# Patient Record
Sex: Male | Born: 2000 | Race: Black or African American | Hispanic: No | Marital: Single | Smoking: Never smoker
Health system: Southern US, Community
[De-identification: ages and names within clinical notes are randomized; demographics above are authoritative.]

## PROBLEM LIST (undated history)

## (undated) DIAGNOSIS — S43006A Unspecified dislocation of unspecified shoulder joint, initial encounter: Secondary | ICD-10-CM

## (undated) HISTORY — PX: WISDOM TOOTH EXTRACTION: SHX21

---
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
## 2019-01-03 ENCOUNTER — Encounter (HOSPITAL_COMMUNITY): Payer: Self-pay

## 2019-01-03 ENCOUNTER — Ambulatory Visit (HOSPITAL_COMMUNITY)
Admission: EM | Admit: 2019-01-03 | Discharge: 2019-01-03 | Disposition: A | Discharge status: Home / Self Care | Payer: Managed Care, Other (non HMO) | Attending: Internal Medicine | Admitting: Internal Medicine

## 2019-01-03 ENCOUNTER — Ambulatory Visit (INDEPENDENT_AMBULATORY_CARE_PROVIDER_SITE_OTHER): Payer: Managed Care, Other (non HMO)

## 2019-01-03 DIAGNOSIS — M25552 Pain in left hip: Secondary | ICD-10-CM

## 2019-01-03 DIAGNOSIS — M7632 Iliotibial band syndrome, left leg: Secondary | ICD-10-CM | POA: Diagnosis not present

## 2019-01-03 DIAGNOSIS — M461 Sacroiliitis, not elsewhere classified: Secondary | ICD-10-CM | POA: Diagnosis not present

## 2019-01-03 MED ORDER — TRAMADOL HCL 50 MG PO TABS: 50.0000 mg | ORAL_TABLET | Freq: Four times a day (QID) | ORAL | 0 refills | Status: DC | PRN

## 2019-01-03 MED ORDER — CYCLOBENZAPRINE HCL 10 MG PO TABS: ORAL_TABLET | ORAL | 0 refills | Status: DC

## 2019-01-03 NOTE — ED Provider Notes (Signed)
[facility]    CSN: [id number] Arrival date & time: 01/03/19  1853      History   Chief Complaint Chief Complaint  Patient presents with  . Hip Injury  . Back Pain  . Leg Pain    HPI Austin Perkins is a 18 y.o. male. who presents with back pain whe n he was lifting a 127 lb box helping himself by pushing with his foot on the bottom of the box to get it to his shoulders and felt a pop and pain on his L lower back and L leg to his toes. He pushed himself to continue working limping around. He did not report it to his employer since there was was a replacement for his usual supervisor. When he went home he was in severe pain and collapsed on the floor and his brother and mother helped him get up off the floor. H applied icyphot and took one rx  Ibuprofen ( unknown dose) which did not help, so around 2 am took a Tylenol pm and was able to fall asleep. Woke up around 11 am this am with a lot of pain. He applied icy- hot again and laid around all day. He finally decided to come since he could not hardly get in the shower. Has weakness on his L leg and feels when he tries to walk his leg gives out. Pain is described as aching, which is constant. Pain is provoked with walking, baring wt on this L leg. Pain is alleviated with no movement to 6//10, but when tries to move pain is 8/10. Denies loss of bladder or bowels. Denies pelvic area numbness.  Denies prior injury or back or his L leg before.    Past Medical History:  Diagnosis Date  . Shoulder dislocation     Patient Active Problem List   Diagnosis Date Noted  . Acromioclavicular sprain, right, initial encounter 12/16/2015    Past Surgical History:  Procedure Laterality Date  . WISDOM TOOTH EXTRACTION         Home Medications    Prior to Admission medications   Medication Sig Start Date End Date Taking? Authorizing Provider  diclofenac (VOLTAREN) 75 MG EC tablet Take 1 tablet (75 mg total) by mouth 2 (two) times  daily. Patient not taking: Reported on 09/03/2018 04/27/18   Vanessa Kick, MD  fluticasone [facility]) 50 MCG/ACT nasal spray Place 2 sprays into both nostrils daily. 12/26/18   Couture, Cortni S, PA-C  naproxen (NAPROSYN) 375 MG tablet Take 1 tablet (375 mg total) by mouth 2 (two) times daily. 09/03/18   Margarita Mail, PA-C    Family History Family History  Problem Relation Age of Onset  . Healthy Mother     Social History Social History   Tobacco Use  . Smoking status: Never Smoker  . Smokeless tobacco: Never Used  Substance Use Topics  . Alcohol use: No  . Drug use: No     Allergies   Amoxicillin   Review of Systems Review of Systems  Constitutional: Negative for chills and fever.  Gastrointestinal: Negative for abdominal pain.       Denies bowel incontinence  Genitourinary: Negative for difficulty urinating and flank pain.  Musculoskeletal: Positive for arthralgias, back pain, gait problem and myalgias.  Skin: Negative for rash and wound.  Neurological: Positive for weakness. Negative for numbness.     Physical Exam Triage Vital Signs ED Triage Vitals  Enc Vitals Group     BP 01/03/19  1930 138/70     Pulse Rate 01/03/19 1930 (!) 58     Resp 01/03/19 1930 16     Temp 01/03/19 1930 99.2 F (37.3 C)     Temp Source 01/03/19 1930 Oral     SpO2 01/03/19 1930 97 %     Weight --      Height --      Head Circumference --      Peak Flow --      Pain Score 01/03/19 1928 8     Pain Loc --      Pain Edu? --      Excl. in [place]? --    No data found.  Updated Vital Signs BP 138/70 (BP Location: Left Arm)   Pulse (!) 58   Temp 99.2 F (37.3 C) (Oral)   Resp 16   SpO2 97%   Visual Acuity Right Eye Distance:   Left Eye Distance:   Bilateral Distance:    Right Eye Near:   Left Eye Near:    Bilateral Near:     Physical Exam Vitals signs and nursing note reviewed.  Constitutional:      General: He is not in acute distress.    Appearance: Normal appearance.  He is not toxic-appearing.  HENT:     Head: Normocephalic.     Right Ear: External ear normal.     Left Ear: External ear normal.     Nose: Nose normal.  Eyes:     General: No scleral icterus.    Extraocular Movements: Extraocular movements intact.     Conjunctiva/sclera: Conjunctivae normal.  Neck:     Musculoskeletal: Neck supple.  Pulmonary:     Effort: Pulmonary effort is normal.  Musculoskeletal:        General: Tenderness present. No swelling or deformity.     Right lower leg: No edema.     Left lower leg: No edema.     Comments: BACK- no scoliosis noted, has local tenderness on L SI region to piriformis region. Has Neg SLR. ROM is decreased due to pain.   PELVIS/HIP- leg length is equal, has local tenderness on the L proximal IT band region with hip rotation, palpation, and straightening his L leg up.   Skin:    General: Skin is warm and dry.  Neurological:     Mental Status: He is alert and oriented to person, place, and time.     Sensory: No sensory deficit.     Motor: Weakness present.     Gait: Gait abnormal.     Deep Tendon Reflexes: Reflexes normal.     Comments: L leg strength +3/5, R leg strength +5/5 Has a limping gait.   Psychiatric:        Mood and Affect: Mood normal.        Behavior: Behavior normal.        Thought Content: Thought content normal.        Judgment: Judgment normal.    UC Treatments / Results  Labs (all labs ordered are listed, but only abnormal results are displayed) Labs Reviewed - No data to display  EKG   Radiology No results found.  Procedures Procedures (including critical care time)  Medications Ordered in UC Medications - No data to display  Initial Impression / Assessment and Plan / UC Course  I have reviewed the triage vital signs and the nursing notes. Pertinent  imaging results that were available during my care of the patient were reviewed  by me and considered in my medical decision making (see chart for  details). I placed him on Tramadol and Flexeril. Advised to ice areas of pain for 20 min 2-4 times a day.  Needs to report his job injury to his supervisor and FU with occ med at [facility] or their preferred location in 2 days.   Final Clinical Impressions(s) / UC Diagnoses   Final diagnoses:  None   Discharge Instructions   None    ED Prescriptions    None     PDMP not reviewed this encounter.   Shelby Mattocks, Hershal Coria 01/03/19 2221

## 2019-01-03 NOTE — Discharge Instructions (Addendum)
Follow up with Occupational Health in 2 days. Use ice on areas of pain 20 minutes at a time 2-4 times a day.

## 2019-01-03 NOTE — ED Triage Notes (Signed)
Pt present with hip pain, lower back pain and left leg pain x 1 day. Pt states he was lifting a heavy object last night at work, and he felt his left leg popped. Pt denies any numbness or tingling sensation in his left leg.

## 2019-02-12 ENCOUNTER — Emergency Department (HOSPITAL_COMMUNITY): Payer: Commercial Indemnity

## 2019-02-12 ENCOUNTER — Other Ambulatory Visit: Payer: Self-pay

## 2019-02-12 ENCOUNTER — Emergency Department (HOSPITAL_COMMUNITY)
Admission: EM | Admit: 2019-02-12 | Discharge: 2019-02-12 | Disposition: A | Discharge status: Home / Self Care | Payer: Commercial Indemnity | Attending: Emergency Medicine | Admitting: Emergency Medicine

## 2019-02-12 ENCOUNTER — Encounter (HOSPITAL_COMMUNITY): Payer: Self-pay | Admitting: Emergency Medicine

## 2019-02-12 DIAGNOSIS — U071 COVID-19: Secondary | ICD-10-CM | POA: Diagnosis not present

## 2019-02-12 DIAGNOSIS — R05 Cough: Secondary | ICD-10-CM | POA: Diagnosis not present

## 2019-02-12 DIAGNOSIS — R0789 Other chest pain: Secondary | ICD-10-CM | POA: Diagnosis not present

## 2019-02-12 DIAGNOSIS — R0602 Shortness of breath: Secondary | ICD-10-CM | POA: Diagnosis present

## 2019-02-12 DIAGNOSIS — J069 Acute upper respiratory infection, unspecified: Secondary | ICD-10-CM

## 2019-02-12 MED ORDER — GUAIFENESIN ER 1200 MG PO TB12: 1.0000 | ORAL_TABLET | Freq: Two times a day (BID) | ORAL | 0 refills | Status: DC

## 2019-02-12 MED ORDER — PROMETHAZINE-DM 6.25-15 MG/5ML PO SYRP: 5.0000 mL | ORAL_SOLUTION | Freq: Four times a day (QID) | ORAL | 0 refills | Status: DC | PRN

## 2019-02-12 NOTE — ED Triage Notes (Signed)
C/o non-productive cough, sob, and pain across chest only when coughing since yesterday.  Reports his mom his COVID +.

## 2019-02-12 NOTE — ED Notes (Signed)
Pt discharge instructions and prescriptions reviewed with the patient. The patient verbalized understanding of both. Pt discharged. 

## 2019-02-12 NOTE — Discharge Instructions (Signed)
You will need to quarantine until your results have returned.  Return here for any worsening in your condition.  Increase your fluid intake and rest as much as possible.

## 2019-02-13 LAB — NOVEL CORONAVIRUS, NAA (HOSP ORDER, SEND-OUT TO REF LAB; TAT 18-24 HRS): SARS-CoV-2, NAA: DETECTED — AB

## 2019-02-14 ENCOUNTER — Telehealth (HOSPITAL_COMMUNITY): Payer: Self-pay

## 2019-02-14 NOTE — ED Provider Notes (Signed)
[place] EMERGENCY DEPARTMENT Provider Note   CSN: [id number] Arrival date & time: 02/12/19  1001     History Chief Complaint  Patient presents with  . ? COVID  . Cough  . Shortness of Breath    Austin Perkins is a 18 y.o. male.  HPI Patient presents to the emergency department with productive cough with pain across his chest when coughing along with shortness of breath is been ongoing for 2 days.  The patient states his mom is Covid positive.  Patient states is not take any medications prior to arrival for symptoms.  Patient denies any other symptoms at this time.  The patient denies headache,blurred vision, neck pain, fever, weakness, numbness, dizziness, anorexia, edema, abdominal pain, nausea, vomiting, diarrhea, rash, back pain, dysuria, hematemesis, bloody stool, near syncope, or syncope.    Past Medical History:  Diagnosis Date  . Shoulder dislocation     Patient Active Problem List   Diagnosis Date Noted  . Acromioclavicular sprain, right, initial encounter 12/16/2015    Past Surgical History:  Procedure Laterality Date  . WISDOM TOOTH EXTRACTION         Family History  Problem Relation Age of Onset  . Healthy Mother     Social History   Tobacco Use  . Smoking status: Never Smoker  . Smokeless tobacco: Never Used  Substance Use Topics  . Alcohol use: No  . Drug use: No    Home Medications Prior to Admission medications   Medication Sig Start Date End Date Taking? Authorizing Provider  cyclobenzaprine (FLEXERIL) 10 MG tablet 1/2 to 1 tid prn back pain and muscle tightness 01/03/19   Rodriguez-Southworth, Sunday Spillers, PA-C  fluticasone (FLONASE) 50 MCG/ACT nasal spray Place 2 sprays into both nostrils daily. 12/26/18   Couture, Cortni S, PA-C  Guaifenesin 1200 MG TB12 Take 1 tablet (1,200 mg total) by mouth 2 (two) times daily. 02/12/19   Karaline Buresh, Harrell Gave, PA-C  naproxen (NAPROSYN) 375 MG tablet Take 1 tablet (375 mg total) by  mouth 2 (two) times daily. 09/03/18   Harris, Vernie Shanks, PA-C  promethazine-dextromethorphan (PROMETHAZINE-DM) 6.25-15 MG/5ML syrup Take 5 mLs by mouth 4 (four) times daily as needed for cough. 02/12/19   Baylyn Sickles, Harrell Gave, PA-C  traMADol (ULTRAM) 50 MG tablet Take 1 tablet (50 mg total) by mouth every 6 (six) hours as needed for moderate pain. 01/03/19   Rodriguez-Southworth, Sunday Spillers, PA-C    Allergies    Amoxicillin  Review of Systems   Review of Systems All other systems negative except as documented in the HPI. All pertinent positives and negatives as reviewed in the HPI. Physical Exam Updated Vital Signs BP (!) 152/91 (BP Location: Right Arm)   Pulse 77   Temp 98.6 F (37 C) (Oral)   Resp 16   SpO2 98%   Physical Exam Vitals and nursing note reviewed.  Constitutional:      General: He is not in acute distress.    Appearance: He is well-developed.  HENT:     Head: Normocephalic and atraumatic.  Eyes:     Pupils: Pupils are equal, round, and reactive to light.  Cardiovascular:     Rate and Rhythm: Normal rate and regular rhythm.     Heart sounds: Normal heart sounds. No murmur. No friction rub. No gallop.   Pulmonary:     Effort: Pulmonary effort is normal. No respiratory distress.     Breath sounds: Normal breath sounds. No wheezing.  Abdominal:     General:  Bowel sounds are normal. There is no distension.     Palpations: Abdomen is soft.     Tenderness: There is no abdominal tenderness.  Musculoskeletal:     Cervical back: Normal range of motion and neck supple.  Skin:    General: Skin is warm and dry.     Capillary Refill: Capillary refill takes less than 2 seconds.     Findings: No erythema or rash.  Neurological:     Mental Status: He is alert and oriented to person, place, and time.     Motor: No abnormal muscle tone.     Coordination: Coordination normal.  Psychiatric:        Behavior: Behavior normal.     ED Results / Procedures / Treatments    Labs (all labs ordered are listed, but only abnormal results are displayed) Labs Reviewed  NOVEL CORONAVIRUS, NAA (HOSP ORDER, SEND-OUT TO REF LAB; TAT 18-24 HRS) - Abnormal; Notable for the following components:      Result Value   SARS-CoV-2, NAA DETECTED (*)    All other components within normal limits    EKG None  Radiology No results found.  Procedures Procedures (including critical care time)  Medications Ordered in ED Medications - No data to display  ED Course  I have reviewed the triage vital signs and the nursing notes.  Pertinent labs & imaging results that were available during my care of the patient were reviewed by me and considered in my medical decision making (see chart for details).    MDM Rules/Calculators/A&P                      [place] no signs of acute distress at this time.  Advised patient that he will need to return here for any worsening in his condition.  Told him to quarantine until his results are back and the patient is advised to increase fluid intake and rest as much as possible. Final Clinical Impression(s) / ED Diagnoses Final diagnoses:  Upper respiratory tract infection, unspecified type    Rx / DC Orders ED Discharge Orders         Ordered    promethazine-dextromethorphan (PROMETHAZINE-DM) 6.25-15 MG/5ML syrup  4 times daily PRN     02/12/19 1333    Guaifenesin 1200 MG TB12  2 times daily     02/12/19 [address], PA-C 02/14/19 1609    Quintella Reichert, MD 02/15/19 [phone]

## 2019-06-02 ENCOUNTER — Encounter (HOSPITAL_COMMUNITY): Payer: Self-pay

## 2019-06-02 ENCOUNTER — Ambulatory Visit (HOSPITAL_COMMUNITY)
Admission: EM | Admit: 2019-06-02 | Discharge: 2019-06-02 | Disposition: A | Discharge status: Home / Self Care | Payer: Managed Care, Other (non HMO) | Attending: Family Medicine | Admitting: Family Medicine

## 2019-06-02 ENCOUNTER — Other Ambulatory Visit: Payer: Self-pay

## 2019-06-02 DIAGNOSIS — J020 Streptococcal pharyngitis: Secondary | ICD-10-CM | POA: Insufficient documentation

## 2019-06-02 DIAGNOSIS — Z20822 Contact with and (suspected) exposure to covid-19: Secondary | ICD-10-CM | POA: Insufficient documentation

## 2019-06-02 LAB — POCT RAPID STREP A: Streptococcus, Group A Screen (Direct): POSITIVE — AB

## 2019-06-02 MED ORDER — AZITHROMYCIN 250 MG PO TABS: ORAL_TABLET | ORAL | 0 refills | Status: DC

## 2019-06-02 MED ORDER — LIDOCAINE VISCOUS HCL 2 % MT SOLN: 15.0000 mL | OROMUCOSAL | 0 refills | Status: DC | PRN

## 2019-06-02 NOTE — ED Triage Notes (Signed)
Pt c/o sore throat for two days, also reports runny nose and HA.  Denies cough, SOB, abdom pain, n/v/d, fever, chills.  Was COVID positive 02/12/19 Tonsils enlarged, erythemic, with white exudate.

## 2019-06-04 LAB — SARS CORONAVIRUS 2 (TAT 6-24 HRS): SARS Coronavirus 2: NEGATIVE

## 2019-06-04 NOTE — ED Provider Notes (Addendum)
[place]    CSN: [id number] Arrival date & time: 06/02/19  1818      History   Chief Complaint Chief Complaint  Patient presents with  . Sore Throat    HPI Austin Perkins is a 19 y.o. male.   HPI  Patient presents for evaluation of sore throat x 2 days. Associated symptoms include nasal drainage, headache, and severe pain with swallowing. S/P COVID-11 February 2019. Denies fever, nausea, vomiting, abdominal pain, cough, or shortness of breath. Denies knowledge of recent sick contacts or exposure to anyone recently infected with COVID-19. Past Medical History:  Diagnosis Date  . Shoulder dislocation     Patient Active Problem List   Diagnosis Date Noted  . Acromioclavicular sprain, right, initial encounter 12/16/2015    Past Surgical History:  Procedure Laterality Date  . WISDOM TOOTH EXTRACTION         Home Medications    Prior to Admission medications   Medication Sig Start Date End Date Taking? Authorizing Provider  azithromycin (ZITHROMAX) 250 MG tablet Take 2 tabs PO x 1 dose, then 1 tab PO QD x 4 days 06/02/19   Scot Jun, FNP  cyclobenzaprine (FLEXERIL) 10 MG tablet 1/2 to 1 tid prn back pain and muscle tightness 01/03/19   Rodriguez-Southworth, Sunday Spillers, PA-C  fluticasone (FLONASE) 50 MCG/ACT nasal spray Place 2 sprays into both nostrils daily. 12/26/18   Couture, Cortni S, PA-C  Guaifenesin 1200 MG TB12 Take 1 tablet (1,200 mg total) by mouth 2 (two) times daily. 02/12/19   Lawyer, Harrell Gave, PA-C  lidocaine (XYLOCAINE) 2 % solution Use as directed 15 mLs in the mouth or throat every 3 (three) hours as needed for mouth pain. 06/02/19   Scot Jun, FNP  naproxen (NAPROSYN) 375 MG tablet Take 1 tablet (375 mg total) by mouth 2 (two) times daily. 09/03/18   Albert Hersch, Vernie Shanks, PA-C  promethazine-dextromethorphan (PROMETHAZINE-DM) 6.25-15 MG/5ML syrup Take 5 mLs by mouth 4 (four) times daily as needed for cough. 02/12/19   Lawyer,  Harrell Gave, PA-C  traMADol (ULTRAM) 50 MG tablet Take 1 tablet (50 mg total) by mouth every 6 (six) hours as needed for moderate pain. 01/03/19   Rodriguez-Southworth, Sunday Spillers, PA-C    Family History Family History  Problem Relation Age of Onset  . Healthy Mother     Social History Social History   Tobacco Use  . Smoking status: Never Smoker  . Smokeless tobacco: Never Used  Substance Use Topics  . Alcohol use: No  . Drug use: No     Allergies   Amoxicillin   Review of Systems Review of Systems Pertinent negatives listed in HPI  Physical Exam Triage Vital Signs ED Triage Vitals  Enc Vitals Group     BP 06/02/19 1958 (!) 153/87     Pulse Rate 06/02/19 1958 86     Resp 06/02/19 1958 18     Temp 06/02/19 1958 99.1 F (37.3 C)     Temp Source 06/02/19 1958 Oral     SpO2 06/02/19 1958 100 %     Weight --      Height --      Head Circumference --      Peak Flow --      Pain Score 06/02/19 1954 8     Pain Loc --      Pain Edu? --      Excl. in [place]? --    No data found.  Updated Vital Signs BP (!) 153/87 (  BP Location: Left Arm)   Pulse 86   Temp 99.1 F (37.3 C) (Oral)   Resp 18   SpO2 100%   Visual Acuity Right Eye Distance:   Left Eye Distance:   Bilateral Distance:    Right Eye Near:   Left Eye Near:    Bilateral Near:     Physical Exam Vitals reviewed.  Constitutional:      Appearance: He is ill-appearing.  HENT:     Head: Normocephalic.     Nose: Rhinorrhea present.     Mouth/Throat:     Mouth: Mucous membranes are moist.     Pharynx: No posterior oropharyngeal erythema or uvula swelling.     Tonsils: Tonsillar exudate present. 3+ on the right. 3+ on the left.  Eyes:     Conjunctiva/sclera: Conjunctivae normal.  Cardiovascular:     Rate and Rhythm: Normal rate and regular rhythm.  Pulmonary:     Effort: Pulmonary effort is normal.     Breath sounds: Normal breath sounds.  Skin:    General: Skin is warm.     Findings: No rash.    Neurological:     Mental Status: He is oriented to person, place, and time.  Psychiatric:        Mood and Affect: Mood normal.      UC Treatments / Results  Labs (all labs ordered are listed, but only abnormal results are displayed) Labs Reviewed  POCT RAPID STREP A - Abnormal; Notable for the following components:      Result Value   Streptococcus, Group A Screen (Direct) POSITIVE (*)    All other components within normal limits  SARS CORONAVIRUS 2 (TAT 6-24 HRS)    EKG   Radiology No results found.  Procedures Procedures (including critical care time)  Medications Ordered in UC Medications - No data to display  Initial Impression / Assessment and Plan / UC Course  I have reviewed the triage vital signs and the nursing notes.  Pertinent labs & imaging results that were available during my care of the patient were reviewed by me and considered in my medical decision making (see chart for details).   Rapid strep positive.  COVID-19 test pending. Patient has a penicillin allergy which resulted in anaphylactic-like reaction therefore will avoid cephalosporins.  Will cover with azithromycin and provide lidocaine viscous for management of throat pain.  Advised to remain hydrated as he does have a low-grade temperature present today.  Advised if symptoms worsen or do not improve return for follow-up. Final Clinical Impressions(s) / UC Diagnoses   Final diagnoses:  Strep throat   Discharge Instructions   None    ED Prescriptions    Medication Sig Dispense Auth. Provider   azithromycin (ZITHROMAX) 250 MG tablet Take 2 tabs PO x 1 dose, then 1 tab PO QD x 4 days 6 tablet Scot Jun, FNP   lidocaine (XYLOCAINE) 2 % solution Use as directed 15 mLs in the mouth or throat every 3 (three) hours as needed for mouth pain. 100 mL Scot Jun, FNP     PDMP not reviewed this encounter.   Scot Jun, FNP 06/04/19 0906    Scot Jun, FNP 06/04/19  [phone]

## 2019-06-05 ENCOUNTER — Other Ambulatory Visit: Payer: Self-pay

## 2019-06-05 ENCOUNTER — Emergency Department (HOSPITAL_COMMUNITY)
Admission: EM | Admit: 2019-06-05 | Discharge: 2019-06-06 | Disposition: A | Discharge status: Home / Self Care | Payer: Managed Care, Other (non HMO) | Attending: Emergency Medicine | Admitting: Emergency Medicine

## 2019-06-05 DIAGNOSIS — Z5321 Procedure and treatment not carried out due to patient leaving prior to being seen by health care provider: Secondary | ICD-10-CM | POA: Diagnosis not present

## 2019-06-05 DIAGNOSIS — L299 Pruritus, unspecified: Secondary | ICD-10-CM | POA: Diagnosis present

## 2019-06-05 NOTE — ED Triage Notes (Signed)
Pt in POV, reports itching/rash onset this AM. Pt believes it is related to medicine that was prescribed for strep. However, he states first dose Saturday and S/S didn't start until today. NAD.

## 2019-06-06 NOTE — ED Notes (Signed)
Pt called for vitals x3 no response

## 2019-07-05 ENCOUNTER — Other Ambulatory Visit: Payer: Self-pay

## 2019-07-05 ENCOUNTER — Encounter: Payer: Self-pay | Admitting: Podiatrist

## 2019-07-05 ENCOUNTER — Ambulatory Visit (INDEPENDENT_AMBULATORY_CARE_PROVIDER_SITE_OTHER): Payer: Managed Care, Other (non HMO) | Admitting: Podiatrist

## 2019-07-05 ENCOUNTER — Ambulatory Visit (INDEPENDENT_AMBULATORY_CARE_PROVIDER_SITE_OTHER): Payer: Managed Care, Other (non HMO)

## 2019-07-05 VITALS — Temp 97.3°F

## 2019-07-05 DIAGNOSIS — S92424A Nondisplaced fracture of distal phalanx of right great toe, initial encounter for closed fracture: Secondary | ICD-10-CM

## 2019-07-05 DIAGNOSIS — M79671 Pain in right foot: Secondary | ICD-10-CM | POA: Diagnosis not present

## 2019-07-05 NOTE — Patient Instructions (Signed)
Toe Fracture A toe fracture is a break in one of the toe bones (phalanges). This may happen if you:  Drop a heavy object on your toe.  Stub your toe.  Twist your toe.  Exercise the same way too much. What are the signs or symptoms? The main symptoms are swelling and pain in the toe. You may also have:  Bruising.  Stiffness.  Numbness.  A change in the way the toe looks.  Broken bones that poke through the skin.  Blood under the toenail. How is this treated? Treatments may include:  Taping the broken toe to a toe that is next to it (buddy taping).  Wearing a shoe that has a wide, rigid sole to protect the toe and to limit its movement.  Wearing a cast.  Surgery. This may be needed if the: ? Pieces of broken bone are out of place. ? Bone pokes through the skin.  Physical therapy. Follow these instructions at home: If you have a shoe:  Wear the shoe as told by your doctor. Remove it only as told by your doctor.  Loosen the shoe if your toes tingle, become numb, or turn cold and blue.  Keep the shoe clean and dry. If you have a cast:  Do not put pressure on any part of the cast until it is fully hardened. This may take a few hours.  Do not stick anything inside the cast to scratch your skin.  Check the skin around the cast every day. Tell your doctor about any concerns.  You may put lotion on dry skin around the edges of the cast.  Do not put lotion on the skin under the cast.  Keep the cast clean and dry. Bathing  Do not take baths, swim, or use a hot tub until your doctor says it is okay. Ask your doctor if you can take showers.  If the shoe or cast is not waterproof: ? Do not let it get wet. ? Cover it with a watertight covering when you take a bath or a shower. Activity  Do not use your foot to support your body weight until your doctor says it is okay.  Use crutches as told by your doctor.  Ask your doctor what activities are safe for you  during recovery.  Avoid activities as told by your doctor.  Do exercises as told by your doctor or therapist. Driving  Do not drive or use heavy machinery while taking pain medicine.  Do not drive while wearing a cast on a foot that you use for driving. Managing pain, stiffness, and swelling   Put ice on the injured area if told by your doctor: ? Put ice in a plastic bag. ? Place a towel between your skin and the bag.  If you have a shoe, remove it as told by your doctor.  If you have a cast, place a towel between your cast and the bag. ? Leave the ice on for 20 minutes, 2-3 times per day.  Raise (elevate) the injured area above the level of your heart while you are sitting or lying down. General instructions  If your toe was taped to a toe that is next to it, follow your doctor's instructions for changing the gauze and tape. Change it more often: ? If the gauze and tape get wet. If this happens, dry the space between the toes. ? If the gauze and tape are too tight and they cause your toe to become pale   or to lose feeling (go numb).  If your doctor did not give you a protective shoe, wear sturdy shoes that support your foot. Your shoes should not: ? Pinch your toes. ? Fit tightly against your toes.  Do not use any tobacco products, including cigarettes, chewing tobacco, or e-cigarettes. These can delay bone healing. If you need help quitting, ask your doctor.  Take medicines only as told by your doctor.  Keep all follow-up visits as told by your doctor. This is important. Contact a doctor if:  Your pain medicine is not helping.  You have a fever.  You notice a bad smell coming from your cast. Get help right away if:  You lose feeling (have numbness) in your toe or foot, and it is getting worse.  Your toe or your foot tingles.  Your toe or your foot gets cold or turns blue.  You have redness or swelling in your toe or foot, and it is getting worse.  You have very  bad pain. Summary  A toe fracture is a break in one of the toe bones.  Use ice and raise your foot. This will help lessen pain and swelling.  Use crutches as told by your doctor. This information is not intended to replace advice given to you by your health care provider. Make sure you discuss any questions you have with your health care provider. Document Revised: 04/15/2017 Document Reviewed: 03/23/2017 Elsevier Patient Education  2020 Elsevier Inc.  

## 2019-07-05 NOTE — Progress Notes (Signed)
  Chief Complaint  Patient presents with  . Foot Pain    R hallux - pain and swelling. x2 months. Pt stated, "I work for [employer] and [employer]. I dropped a very heavy package on my toe. I still have swelling that comes and goes. 8-9/10 pain whenever the toe is moved. I can't bend it".     HPI: Patient is 19 y.o. male who presents today for the concerns as listed above.  Patient relates he dropped a heavy package on his toe and has had pain and the inability to bend it easily ever since.  He is tried no treatment.   Review of Systems No fevers, chills, nausea, muscle aches, no difficulty breathing, no calf pain, no chest pain or shortness of breath.   Physical Exam  GENERAL APPEARANCE: Alert, conversant. Appropriately groomed. No acute distress.   VASCULAR: Pedal pulses palpable DP and PT bilateral.  Capillary refill time is immediate to all digits,  Proximal to distal cooling it warm to warm.  Digital hair growth is present bilateral   NEUROLOGIC: sensation is intact epicritically and protectively to 5.07 monofilament at 5/5 sites bilateral.  Light touch is intact bilateral, vibratory sensation intact bilateral, achilles tendon reflex is intact bilateral.   MUSCULOSKELETAL: acceptable muscle strength, tone and stability bilateral.  No gross boney pedal deformities noted.  Pain with range of motion at the great toe joint on the right is noted.  Pain with medial to lateral compression at the interphalangeal joint is also present.  DERMATOLOGIC: skin is warm, supple, and dry.  No open lesions noted.  No rash, no pre ulcerative lesions. Digital nails are asymptomatic.     Xray:  3 views of the right foot are obtained-  Fracture of the medial phalange of the distal phalanx of the great toe is noted.  Slight fracture into the joint noted.  Avulsion fracture seen.    Assessment     ICD-10-CM   1. Right foot pain  M79.671 DG Foot Complete Right  2. Closed nondisplaced fracture of distal phalanx of  right great toe, initial encounter  S92.424A      Plan Recommended a surgical shoe to wear for the next 2 weeks and this was dispensed for him.  He can also try and wear boots or sneakers that he has to his comfort level.  Discussed ultimately healing would least take 6-12 weeks from the date of fracture.  If he has any concerns he will call otherwise he will be seen back as needed.

## 2019-07-06 ENCOUNTER — Other Ambulatory Visit: Payer: Self-pay | Admitting: Podiatrist

## 2019-07-06 DIAGNOSIS — S92424A Nondisplaced fracture of distal phalanx of right great toe, initial encounter for closed fracture: Secondary | ICD-10-CM

## 2019-11-01 ENCOUNTER — Encounter (HOSPITAL_COMMUNITY): Payer: Self-pay

## 2019-11-01 ENCOUNTER — Ambulatory Visit (HOSPITAL_COMMUNITY)
Admission: EM | Admit: 2019-11-01 | Discharge: 2019-11-01 | Disposition: A | Discharge status: Home / Self Care | Payer: Managed Care, Other (non HMO) | Attending: Family Medicine | Admitting: Family Medicine

## 2019-11-01 ENCOUNTER — Other Ambulatory Visit: Payer: Self-pay

## 2019-11-01 DIAGNOSIS — Z202 Contact with and (suspected) exposure to infections with a predominantly sexual mode of transmission: Secondary | ICD-10-CM | POA: Insufficient documentation

## 2019-11-01 MED ORDER — DOXYCYCLINE HYCLATE 100 MG PO CAPS: 100.0000 mg | ORAL_CAPSULE | Freq: Two times a day (BID) | ORAL | 0 refills | Status: DC

## 2019-11-01 NOTE — ED Provider Notes (Signed)
[name]   [id number] 11/01/19 Arrival Time: 8466  ASSESSMENT & PLAN:  1. Exposure to STD    Azithromycin allergy.   Discharge Instructions     You have been given the following today for treatment of suspected chlamydia:  Please pick up your prescription for doxycycline 100 mg and begin taking twice daily for the next seven (7) days.  Even though we have treated you today, we have sent testing for sexually transmitted infections. We will notify you of any positive results once they are received. If required, we will prescribe any medications you might need.  Please refrain from all sexual activity for at least the next seven days.     Pending: Labs Reviewed  CYTOLOGY, (ORAL, ANAL, URETHRAL) ANCILLARY ONLY    Will notify of any positive results. Instructed to refrain from sexual activity for at least seven days.  Reviewed expectations re: course of current medical issues. Questions answered. Outlined signs and symptoms indicating need for more acute intervention. Patient verbalized understanding. After Visit Summary given.   SUBJECTIVE:  Austin Perkins is a 19 y.o. male who reports possible exp to chlamydia; partner +. No symptoms. Afebrile.  OBJECTIVE:  Vitals:   11/01/19 1950  BP: (!) 144/46  Pulse: 60  Resp: 16  Temp: 98.5 F (36.9 C)  TempSrc: Oral  SpO2: 100%     General appearance: alert, cooperative, appears stated age and no distress Abdomen: soft, non-tender GU: normal appearing genitalia Skin: warm and dry Psychological: alert and cooperative; normal mood and affect.    Labs Reviewed  CYTOLOGY, (ORAL, ANAL, URETHRAL) ANCILLARY ONLY    Allergies  Allergen Reactions  . Amoxicillin Hives    Did it involve swelling of the face/tongue/throat, SOB, or low BP? Unknown Did it involve sudden or severe rash/hives, skin peeling, or any reaction on the inside of your mouth or nose? Unknown Did you need to seek medical attention at  a hospital or doctor's office? Unknown When did it last happen? childhood If all above answers are "NO", may proceed with cephalosporin use.   . Azithromycin Hives    Past Medical History:  Diagnosis Date  . Shoulder dislocation    Family History  Problem Relation Age of Onset  . Healthy Mother    Social History   Socioeconomic History  . Marital status: Single    Spouse name: Not on file  . Number of children: Not on file  . Years of education: Not on file  . Highest education level: Not on file  Occupational History  . Not on file  Tobacco Use  . Smoking status: Never Smoker  . Smokeless tobacco: Never Used  Vaping Use  . Vaping Use: Never used  Substance and Sexual Activity  . Alcohol use: No  . Drug use: No  . Sexual activity: Not on file  Other Topics Concern  . Not on file  Social History Narrative  . Not on file   Social Determinants of Health   Financial Resource Strain:   . Difficulty of Paying Living Expenses: Not on file  Food Insecurity:   . Worried About Charity fundraiser in the Last Year: Not on file  . Ran Out of Food in the Last Year: Not on file  Transportation Needs:   . Lack of Transportation (Medical): Not on file  . Lack of Transportation (Non-Medical): Not on file  Physical Activity:   . Days of Exercise per Week: Not on file  . Minutes  of Exercise per Session: Not on file  Stress:   . Feeling of Stress : Not on file  Social Connections:   . Frequency of Communication with Friends and Family: Not on file  . Frequency of Social Gatherings with Friends and Family: Not on file  . Attends Religious Services: Not on file  . Active Member of Clubs or Organizations: Not on file  . Attends Archivist Meetings: Not on file  . Marital Status: Not on file  Intimate Partner Violence:   . Fear of Current or Ex-Partner: Not on file  . Emotionally Abused: Not on file  . Physically Abused: Not on file  . Sexually Abused: Not on  file          Vanessa Kick, MD 11/01/19 [phone]

## 2019-11-01 NOTE — Discharge Instructions (Signed)
You have been given the following today for treatment of suspected chlamydia:  Please pick up your prescription for doxycycline 100 mg and begin taking twice daily for the next seven (7) days.  Even though we have treated you today, we have sent testing for sexually transmitted infections. We will notify you of any positive results once they are received. If required, we will prescribe any medications you might need.  Please refrain from all sexual activity for at least the next seven days.  

## 2019-11-01 NOTE — ED Triage Notes (Signed)
Pt presents for STD's testing. Reports his sexual partner tested positive for chlamydia today.  Denies dysuria, penile discharge, fever.

## 2019-11-02 LAB — CYTOLOGY, (ORAL, ANAL, URETHRAL) ANCILLARY ONLY
Chlamydia: NEGATIVE
Comment: NEGATIVE
Comment: NEGATIVE
Comment: NORMAL
Neisseria Gonorrhea: NEGATIVE
Trichomonas: NEGATIVE

## 2019-12-13 ENCOUNTER — Other Ambulatory Visit: Payer: Self-pay

## 2019-12-13 ENCOUNTER — Ambulatory Visit (HOSPITAL_COMMUNITY)
Admission: EM | Admit: 2019-12-13 | Discharge: 2019-12-13 | Disposition: A | Discharge status: Home / Self Care | Payer: Managed Care, Other (non HMO) | Attending: Family Medicine | Admitting: Family Medicine

## 2019-12-13 ENCOUNTER — Encounter (HOSPITAL_COMMUNITY): Payer: Self-pay | Admitting: Emergency Medicine

## 2019-12-13 DIAGNOSIS — I10 Essential (primary) hypertension: Secondary | ICD-10-CM | POA: Diagnosis present

## 2019-12-13 DIAGNOSIS — J069 Acute upper respiratory infection, unspecified: Secondary | ICD-10-CM

## 2019-12-13 DIAGNOSIS — Z20822 Contact with and (suspected) exposure to covid-19: Secondary | ICD-10-CM | POA: Diagnosis not present

## 2019-12-13 HISTORY — DX: Essential (primary) hypertension: I10

## 2019-12-13 HISTORY — DX: Acute upper respiratory infection, unspecified: J06.9

## 2019-12-13 MED ORDER — AMLODIPINE BESYLATE 5 MG PO TABS: 5.0000 mg | ORAL_TABLET | Freq: Every day | ORAL | 1 refills | Status: DC

## 2019-12-13 NOTE — ED Triage Notes (Signed)
Patient c/o chest pain and sore throat x today.   Patient stated he was around a friend who had a "sore throat" on Saturday.   Patient denies any SOB or cough.

## 2019-12-13 NOTE — Discharge Instructions (Addendum)
Thank you for coming in to see [place] today! Please see below to review our plan for today's visit:  1.  We are starting you on a medication called amlodipine 5 mg, take 1 tablet daily for high blood pressure.  Please follow-up with the [facility] clinic within the next few days to be seen and have your high blood pressure rechecked. 2.  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 3.  I have given you information regarding the DASH diet, guidelines for reducing high blood pressure through dietary changes.  Please look at this information and do your best to implement it as soon as you can. 4.  We will call you with results of your Covid test once they become available.  In the meantime, quarantine and act as if you have Covid.  Should your symptoms take a drastic change for the worse and you should develop shortness of breath or crushing chest pain, please not hesitate to be seen in the emergency department.  It was our pleasure to serve you!   Dr. Milus Banister [facility]

## 2019-12-13 NOTE — ED Provider Notes (Cosign Needed)
[place]    CSN: [id number] Arrival date & time: 12/13/19  1335     History   Chief Complaint Chief Complaint  Patient presents with  . Chest Pain    HPI Austin Perkins is a 19 y.o. male presenting with sore throat and some tender chest pain that started today.  Patient reports that over the weekend on [place] we reviewed no people were wearing masks.  The patient reports that both he and his friend developed a sore throat on Monday.  His friend got tested at work for [employer] today 10/20 and tested positive for COVID (this person was not vaccinated against [place]). His other friends who were there were also tested and tested negative.   Patient reports sensation of chest tightness "as if there is smoke in the air from the fair", dry cough, stuffy nose, headache.  Temps have been elevated at 99 F at home.  Denies runny nose, sneezing, nausea, vomiting, body aches, chills, change in taste/smell.   Vaccinated against [place] with [place] 07/23/19 and 08/13/19 (patient brought his card).  Not vaccinated against flu.   HPI  Past Medical History:  Diagnosis Date  . Shoulder dislocation     Patient Active Problem List   Diagnosis Date Noted  . Acromioclavicular sprain, right, initial encounter 12/16/2015    Past Surgical History:  Procedure Laterality Date  . WISDOM TOOTH EXTRACTION       Home Medications    Prior to Admission medications   Medication Sig Start Date End Date Taking? Authorizing Provider  doxycycline (VIBRAMYCIN) 100 MG capsule Take 1 capsule (100 mg total) by mouth 2 (two) times daily. 11/01/19   Vanessa Kick, MD    Family History Family History  Problem Relation Age of Onset  . Healthy Mother     Social History Social History   Tobacco Use  . Smoking status: Never Smoker  . Smokeless tobacco: Never Used  Vaping Use  . Vaping Use: Never used  Substance Use Topics  . Alcohol use: No  . Drug use: No       Allergies   Amoxicillin and Azithromycin   Review of Systems Review of Systems - see HPI   Physical Exam Triage Vital Signs ED Triage Vitals  Enc Vitals Group     BP 12/13/19 1545 (!) 153/78     Pulse Rate 12/13/19 1545 (!) 52     Resp 12/13/19 1545 19     Temp 12/13/19 1545 98.5 F (36.9 C)     Temp Source 12/13/19 1545 Oral     SpO2 12/13/19 1545 100 %     Weight 12/13/19 1543 179 lb 14.3 oz (81.6 kg)     Height 12/13/19 1543 5\' 11"  (1.803 m)     Head Circumference --      Peak Flow --      Pain Score 12/13/19 1543 3     Pain Loc --      Pain Edu? --      Excl. in [place]? --    No data found.  Updated Vital Signs BP (!) 153/78 (BP Location: Right Arm)   Pulse (!) 52   Temp 98.5 F (36.9 C) (Oral)   Resp 19   Ht 5\' 11"  (1.803 m)   Wt 179 lb 14.3 oz (81.6 kg)   SpO2 100%   BMI 25.09 kg/m   Physical exam: General: well appearing, nontoxic appearing  HEENT: Mild erythema  and edema to bilateral nasal turbinates with deviated septum deviates towards patient's left, mildly erythematous pharynx, no tonsillar exudates, however tonsil stone is appreciated on the left, no anterior cervical or posterior cervical lymphadenopathy appreciated Respiratory: CTA bilaterally, comfortable work of breathing, speaking complete sentences Cardio: RRR, S1-S2 present, no murmurs appreciated  UC Treatments / Results  Labs (all labs ordered are listed, but only abnormal results are displayed) Labs Reviewed  SARS CORONAVIRUS 2 (TAT 6-24 HRS)    EKG   Radiology No results found.  Procedures Procedures (including critical care time)  Medications Ordered in UC Medications - No data to display  Initial Impression / Assessment and Plan / UC Course  I have reviewed the triage vital signs and the nursing notes.  Pertinent labs & imaging results that were available during my care of the patient were reviewed by me and considered in my medical decision making (see chart for  details).   URI  exposure to Covid positive person: Patient with some symptoms of upper respiratory infection in setting of being in a crowded setting.  Patient is vaccinated against COVID. -Patient was tested for Covid today -Patient structured to quarantine until he hears the results of his Covid test -Strict return precautions provided  High blood pressure: Patient's blood pressure today 153/78.  According to the patient's chart he has a history of high blood pressure for which he was previously treated with Vasotec as of the 19 year old.  He reports that for the last couple years he is not taking anything for his blood pressure.  Patient has an listed in the [employer] and has to report on December 26, 2019. -Patient started on amlodipine 5 mg daily -Patient's primary care doctor's office was contacted to establish a follow-up appointment for the patient to recheck his blood pressure in a few days before he enlists    Final Clinical Impressions(s) / UC Diagnoses   Final diagnoses:  None   Discharge Instructions   None    ED Prescriptions    None     PDMP not reviewed this encounter.   Milus Banister, [place], PGY-3 12/13/2019 3:54 PM    Daisy Floro, DO 12/13/19 [phone]

## 2019-12-14 LAB — SARS CORONAVIRUS 2 (TAT 6-24 HRS): SARS Coronavirus 2: NEGATIVE

## 2020-11-25 ENCOUNTER — Ambulatory Visit (HOSPITAL_COMMUNITY)
Admission: EM | Admit: 2020-11-25 | Discharge: 2020-11-25 | Discharge status: Against Medical Advice | Payer: Managed Care, Other (non HMO)

## 2020-11-25 ENCOUNTER — Other Ambulatory Visit: Payer: Self-pay

## 2020-12-17 IMAGING — DX DG SHOULDER 2+V*R*
4 series · 4 of 4 positions shown · non-contrast
Comparison: 12/26/2017

CLINICAL DATA: Prior right shoulder dislocation. No recent injury.
Pain.

EXAM:
RIGHT SHOULDER - 2+ VIEW

[shoulder ap]
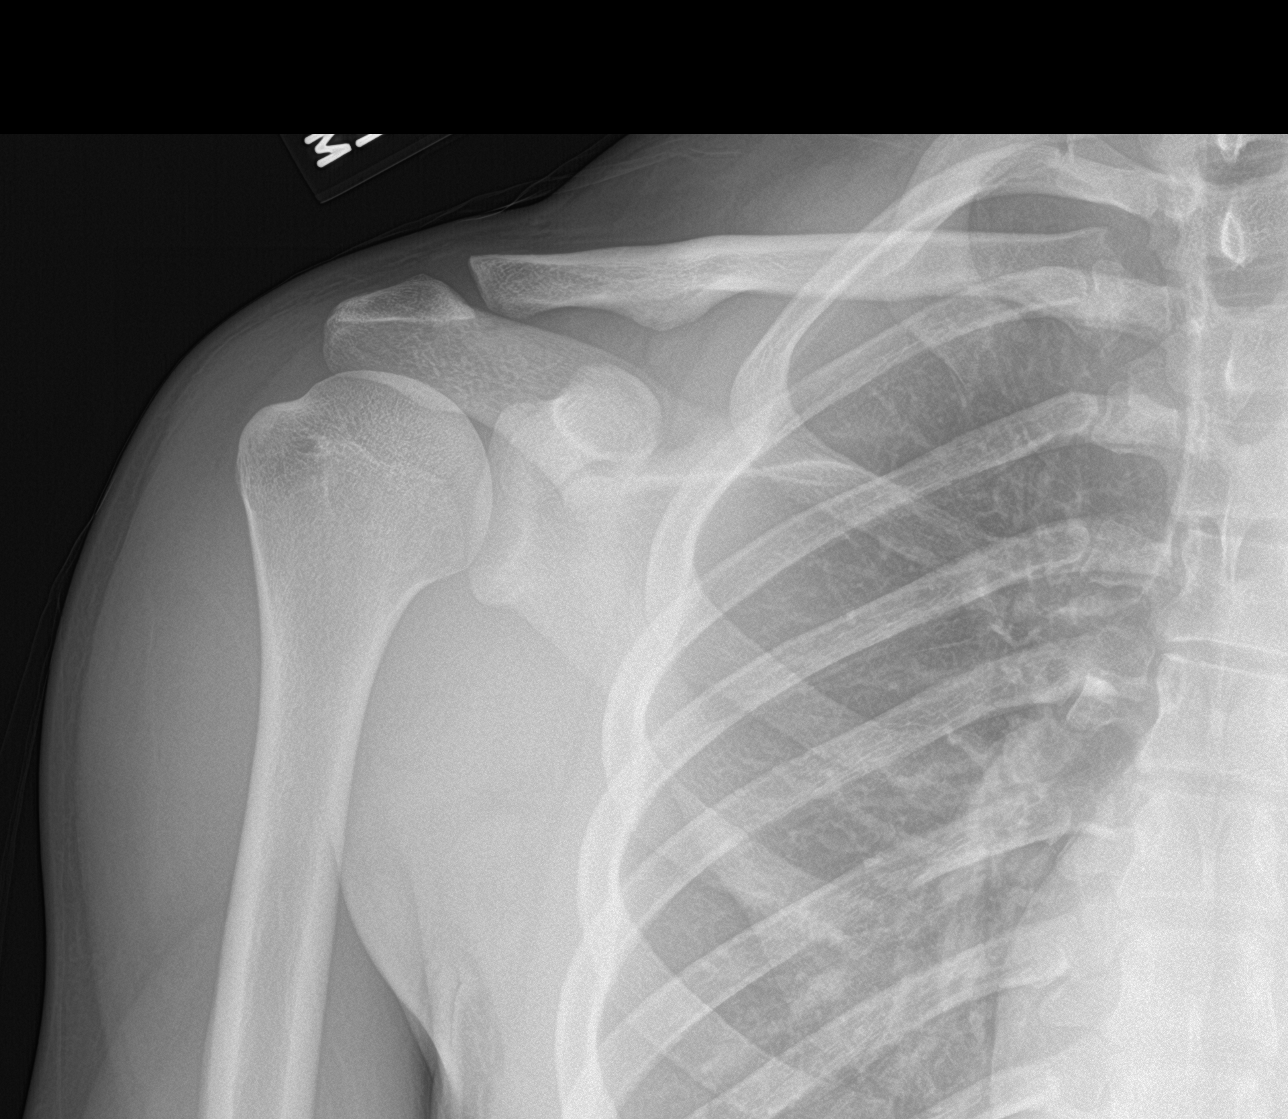

[shoulder grashey]
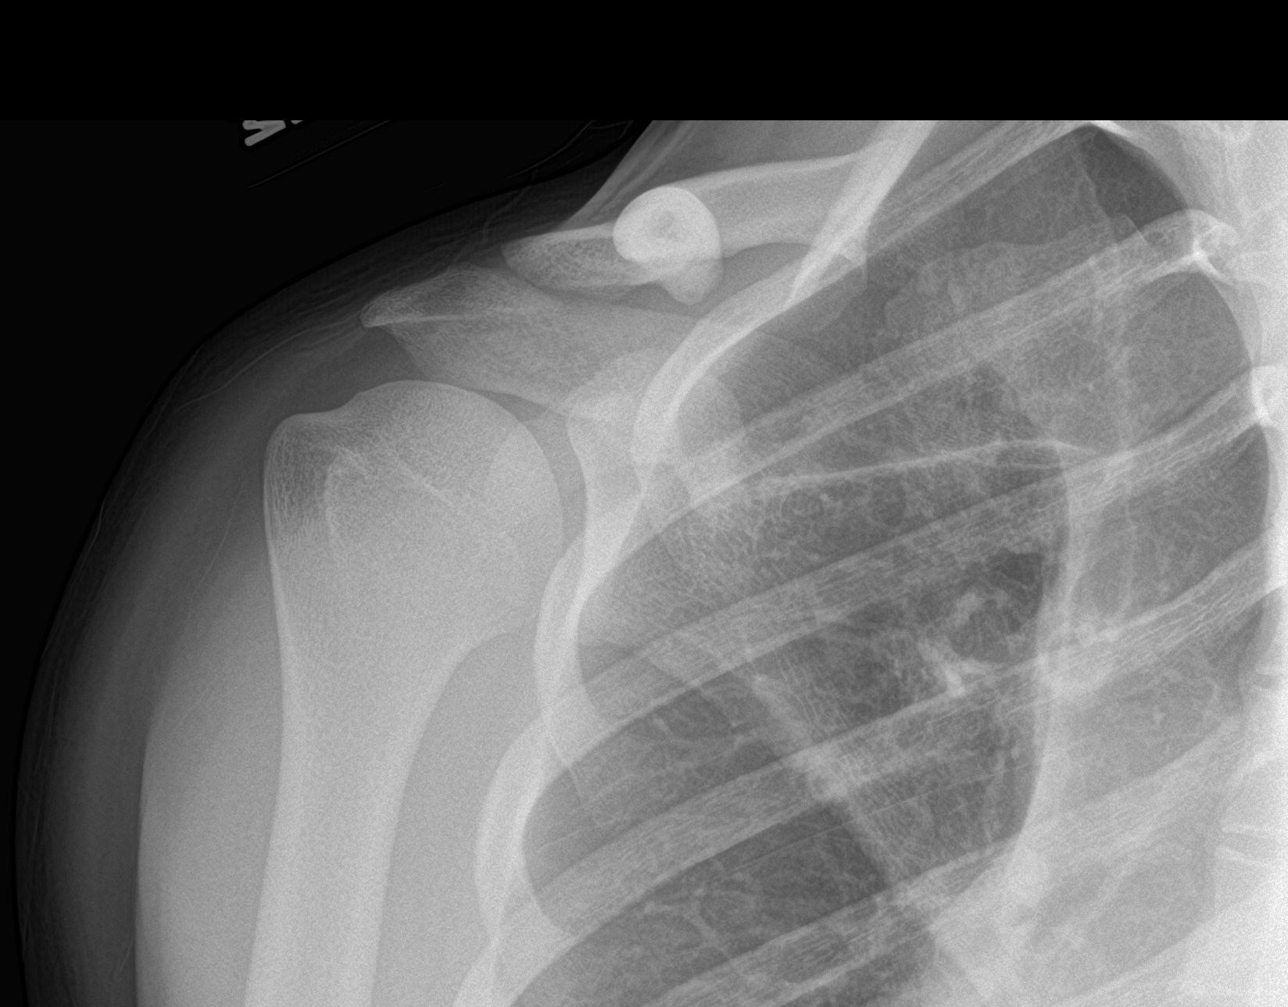

[shoulder y-view]
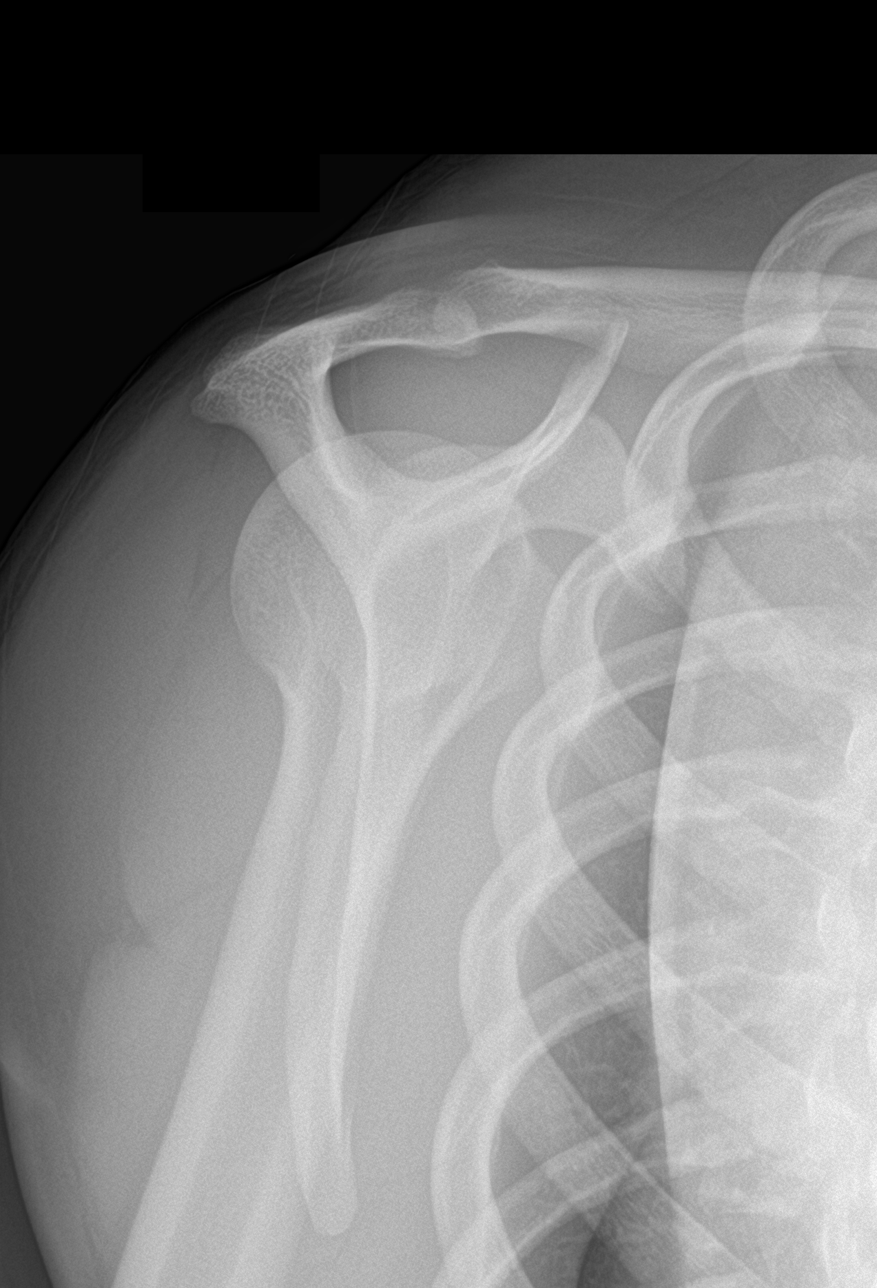

[shoulder axial]
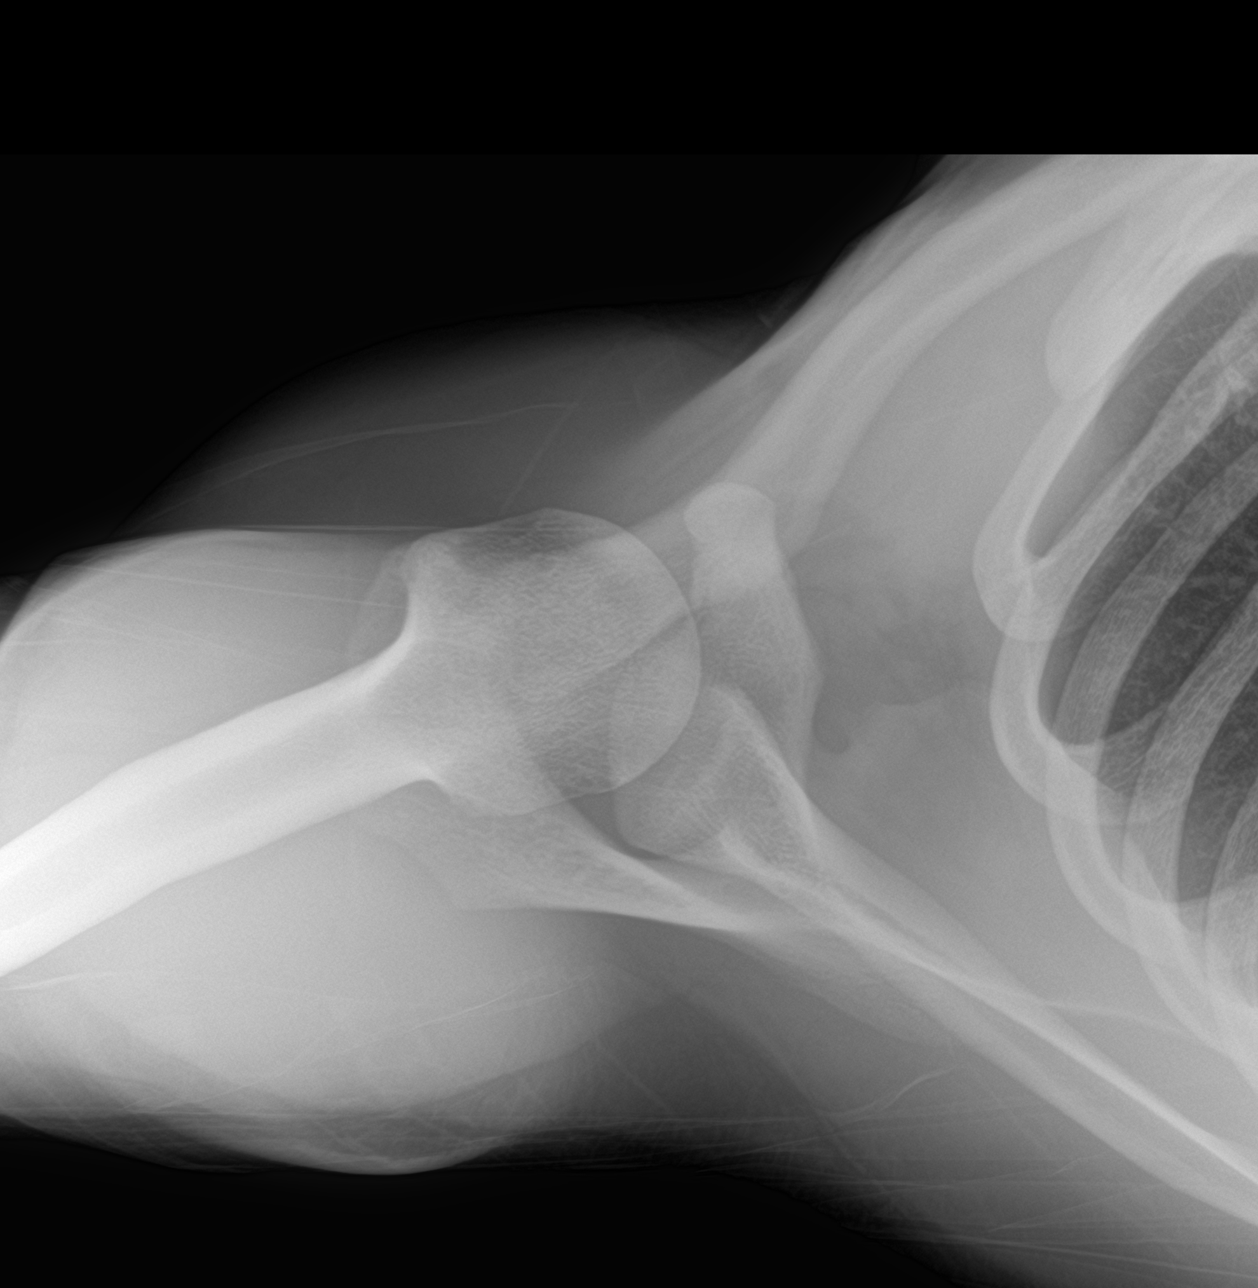

[4 of 4 positions shown; findings below may reference images not displayed]

FINDINGS: There is no evidence of fracture or dislocation. There is no
evidence of arthropathy or other focal bone abnormality. Soft
tissues are unremarkable.
IMPRESSION: Negative.

## 2021-07-20 ENCOUNTER — Ambulatory Visit (INDEPENDENT_AMBULATORY_CARE_PROVIDER_SITE_OTHER): Payer: BC Managed Care – PPO

## 2021-07-20 ENCOUNTER — Ambulatory Visit (HOSPITAL_COMMUNITY)
Admission: EM | Admit: 2021-07-20 | Discharge: 2021-07-20 | Disposition: A | Discharge status: Home / Self Care | Payer: BC Managed Care – PPO | Attending: Internal Medicine | Admitting: Internal Medicine

## 2021-07-20 DIAGNOSIS — M79671 Pain in right foot: Secondary | ICD-10-CM | POA: Diagnosis not present

## 2021-07-20 DIAGNOSIS — S93501A Unspecified sprain of right great toe, initial encounter: Secondary | ICD-10-CM

## 2021-07-20 DIAGNOSIS — S93509A Unspecified sprain of unspecified toe(s), initial encounter: Secondary | ICD-10-CM

## 2021-07-20 DIAGNOSIS — W19XXXA Unspecified fall, initial encounter: Secondary | ICD-10-CM | POA: Diagnosis not present

## 2021-07-20 MED ORDER — IBUPROFEN 800 MG PO TABS
ORAL_TABLET | ORAL | Status: AC
  Filled 2021-07-20: qty 1

## 2021-07-20 MED ORDER — CETIRIZINE HCL 10 MG PO TABS: 10.0000 mg | ORAL_TABLET | Freq: Every day | ORAL | 2 refills | Status: AC

## 2021-07-20 MED ORDER — FLUTICASONE PROPIONATE 50 MCG/ACT NA SUSP: 1.0000 | Freq: Every day | NASAL | 2 refills | Status: AC

## 2021-07-20 MED ORDER — IBUPROFEN 800 MG PO TABS: 800.0000 mg | ORAL_TABLET | Freq: Three times a day (TID) | ORAL | 0 refills | Status: AC

## 2021-07-20 MED ORDER — IBUPROFEN 800 MG PO TABS
800.0000 mg | ORAL_TABLET | Freq: Once | ORAL | Status: AC
  Administered 2021-07-20: 800 mg via ORAL

## 2021-07-20 NOTE — ED Provider Notes (Signed)
[place]    CSN: [id number] Arrival date & time: 07/20/21  1610      History   Chief Complaint Chief Complaint  Patient presents with   Toe Pain    HPI Austin Perkins is a 21 y.o. male.   Patient presents urgent care for evaluation of his right great toe pain that he has had since last week.  He has a previous history of right great toe pain fracture 1 year ago that he states healed without surgical intervention. Last week, patient was walking up stairs at work when he inverted his ankle stubbing his right great toe on a step.  He states his ankle is mildly sore, but denies injury.  No pain to any other place in his body.  He did not lose consciousness and did not hit his head when he tripped.  He was able to catch himself without falling.  He has been taking Tylenol for his pain.  Pain is currently a 4 on a scale of 0-10.  Pain increases with walking and he walks with a limp.  He has not taken any anti-inflammatory medications for his pain.  No other aggravating or relieving factors identified at this time.   Toe Pain   Past Medical History:  Diagnosis Date   Shoulder dislocation     Patient Active Problem List   Diagnosis Date Noted   Acute upper respiratory infection 12/13/2019   Primary hypertension 12/13/2019   Acromioclavicular sprain, right, initial encounter 12/16/2015    Past Surgical History:  Procedure Laterality Date   WISDOM TOOTH EXTRACTION         Home Medications    Prior to Admission medications   Medication Sig Start Date End Date Taking? Authorizing Provider  cetirizine (ZYRTEC) 10 MG tablet Take 1 tablet (10 mg total) by mouth daily. 07/20/21  Yes Talbot Grumbling, FNP  fluticasone (FLONASE) 50 MCG/ACT nasal spray Place 1 spray into both nostrils daily. 07/20/21  Yes Talbot Grumbling, FNP  ibuprofen (ADVIL) 800 MG tablet Take 1 tablet (800 mg total) by mouth 3 (three) times daily. 07/20/21  Yes Talbot Grumbling, FNP   amLODipine (NORVASC) 5 MG tablet Take 1 tablet (5 mg total) by mouth daily. Patient not taking: Reported on 07/20/2021 12/13/19   Daisy Floro, DO    Family History Family History  Problem Relation Age of Onset   Healthy Mother     Social History Social History   Tobacco Use   Smoking status: Never   Smokeless tobacco: Never  Vaping Use   Vaping Use: Never used  Substance Use Topics   Alcohol use: No   Drug use: No     Allergies   Amoxicillin and Azithromycin   Review of Systems Review of Systems Per HPI  Physical Exam Triage Vital Signs ED Triage Vitals  Enc Vitals Group     BP 07/20/21 1634 (!) 142/87     Pulse Rate 07/20/21 1634 63     Resp 07/20/21 1634 18     Temp 07/20/21 1634 (!) 97.5 F (36.4 C)     Temp Source 07/20/21 1634 Oral     SpO2 07/20/21 1634 98 %     Weight --      Height --      Head Circumference --      Peak Flow --      Pain Score 07/20/21 1633 4     Pain Loc --  Pain Edu? --      Excl. in [place]? --    No data found.  Updated Vital Signs BP (!) 142/87   Pulse 63   Temp (!) 97.5 F (36.4 C) (Oral)   Resp 18   SpO2 98%   Visual Acuity Right Eye Distance:   Left Eye Distance:   Bilateral Distance:    Right Eye Near:   Left Eye Near:    Bilateral Near:     Physical Exam Vitals and nursing note reviewed.  Constitutional:      General: He is not in acute distress.    Appearance: Normal appearance. He is well-developed. He is not ill-appearing.     Comments: Very pleasant patient sitting comfortably on exam able in no acute distress.   HENT:     Head: Normocephalic and atraumatic.     Right Ear: External ear normal.     Left Ear: External ear normal. There is no impacted cerumen.     Nose: Nose normal.     Mouth/Throat:     Mouth: Mucous membranes are moist.     Pharynx: No posterior oropharyngeal erythema.  Eyes:     General: Lids are normal. Vision grossly intact. Gaze aligned appropriately.      Extraocular Movements: Extraocular movements intact.     Conjunctiva/sclera: Conjunctivae normal.     Right eye: Right conjunctiva is not injected.     Left eye: Left conjunctiva is not injected.  Cardiovascular:     Rate and Rhythm: Normal rate and regular rhythm.     Heart sounds: S1 normal and S2 normal.  Pulmonary:     Effort: Pulmonary effort is normal.     Breath sounds: No decreased air movement.  Musculoskeletal:        General: No swelling.     Cervical back: Neck supple.     Right lower leg: No edema.     Left lower leg: No edema.     Comments: Decreased range of motion to right great toe with flexion and extension due to pain.  Patient's pain is worsened with flexion of the great toe.  He is unable to walk without an antalgic gait and walks with his foot everted.  Neurovascularly intact distal to injury.  Denies numbness and tingling distal to injury.  Lymphadenopathy:     Cervical: No cervical adenopathy.  Skin:    General: Skin is warm and dry.     Capillary Refill: Capillary refill takes less than 2 seconds.     Findings: No rash.  Neurological:     General: No focal deficit present.     Mental Status: He is alert and oriented to person, place, and time. Mental status is at baseline.     Cranial Nerves: Cranial nerves 2-12 are intact.     Sensory: Sensation is intact.     Motor: Motor function is intact.     Coordination: Coordination is intact.     Gait: Gait abnormal.     Comments: Patient ambulates with limp due to pain in great toe related to toe sprain. He is otherwise neurologically intact.   Psychiatric:        Attention and Perception: Attention and perception normal.        Mood and Affect: Mood normal.        Speech: Speech normal.        Behavior: Behavior normal. Behavior is cooperative.        Thought Content: Thought content  normal.        Cognition and Memory: Cognition and memory normal.        Judgment: Judgment normal.     UC Treatments /  Results  Labs (all labs ordered are listed, but only abnormal results are displayed) Labs Reviewed - No data to display  EKG   Radiology No results found.  Procedures Procedures (including critical care time)  Medications Ordered in UC Medications  ibuprofen (ADVIL) tablet 800 mg (800 mg Oral Given 07/20/21 1743)    Initial Impression / Assessment and Plan / UC Course  I have reviewed the triage vital signs and the nursing notes.  Pertinent labs & imaging results that were available during my care of the patient were reviewed by me and considered in my medical decision making (see chart for details).   Patient is a 21 year old male presenting to urgent care with right sided great toe pain after he injured it while walking up stairs at his job. X-ray of right foot is negative for acute bony abnormality and fracture. Patient likely sprained his right great toe and has been provided with a post-operative shoe for support to toe. Patient given ibuprofen '800mg'$  for inflammation and pain in clinic and instructed to continue taking antiinflammatory every 8 hours at home for pain/inflammation for the next 2-3 days with food. Next dose of ibuprofen may be tomorrow morning with food since he was given some in the clinic today. Encouraged patient to rest, ice, and elevate right foot/toe to decrease inflammation. Patient given walking referral to [facility] for further evaluation if needed in the next 2-3 weeks if symptoms persist due to prior fracture of right great toe.   Counseled patient regarding appropriate use of medications and potential side effects for all medications recommended or prescribed today. Discussed red flag signs and symptoms of worsening condition,when to call the PCP office, return to urgent care, and when to seek higher level of care. Patient verbalizes understanding and agreement with plan. All questions answered. Patient discharged in stable condition.  Final Clinical  Impressions(s) / UC Diagnoses   Final diagnoses:  Sprain of toe, initial encounter     Discharge Instructions      You were seen in urgent care today for your toe sprain.  Your x-ray did not show any acute abnormality or fracture.  I have prescribed 100 mg of ibuprofen for you to take at home.  Please take this medication every 8 hours with food for inflammation for the next 2 to 3 days.  Your next dose of ibuprofen can be tomorrow morning   Wear the postop shoe provided today for comfort.  Rest, ice, and elevate your right foot.  Sprains can take 2 to 3 weeks to heal.  Follow-up with [facility] as needed for further evaluation.  Work note is at the end of your paperwork.  If you develop any new or worsening symptoms or do not improve in the next 2 to 3 days, please return.  If your symptoms are severe, please go to the emergency room.  Follow-up with your primary care provider for further evaluation and management of your symptoms as well as ongoing wellness visits.  I hope you feel better!     ED Prescriptions     Medication Sig Dispense Auth. Provider   ibuprofen (ADVIL) 800 MG tablet Take 1 tablet (800 mg total) by mouth 3 (three) times daily. 21 tablet Joella Prince M, FNP   fluticasone [facility]) 50 MCG/ACT nasal  spray Place 1 spray into both nostrils daily. 16 g Joella Prince M, FNP   cetirizine (ZYRTEC) 10 MG tablet Take 1 tablet (10 mg total) by mouth daily. 30 tablet Talbot Grumbling, FNP      PDMP not reviewed this encounter.   Talbot Grumbling, [place] 07/22/21 2159

## 2021-07-20 NOTE — Discharge Instructions (Addendum)
You were seen in urgent care today for your toe sprain.  Your x-ray did not show any acute abnormality or fracture.  I have prescribed 100 mg of ibuprofen for you to take at home.  Please take this medication every 8 hours with food for inflammation for the next 2 to 3 days.  Your next dose of ibuprofen can be tomorrow morning   Wear the postop shoe provided today for comfort.  Rest, ice, and elevate your right foot.  Sprains can take 2 to 3 weeks to heal.  Follow-up with [facility] as needed for further evaluation.  Work note is at the end of your paperwork.  If you develop any new or worsening symptoms or do not improve in the next 2 to 3 days, please return.  If your symptoms are severe, please go to the emergency room.  Follow-up with your primary care provider for further evaluation and management of your symptoms as well as ongoing wellness visits.  I hope you feel better!

## 2021-07-20 NOTE — ED Triage Notes (Signed)
Patient presents to Urgent Care with complaints of Right foot big toe pain  since starting new job. Patient reports he broke his toe last year and wore a boot for a few weeks. Since starting this job in [place]. Pain is 4/10 with tylenol

## 2021-08-18 ENCOUNTER — Other Ambulatory Visit: Payer: Self-pay

## 2021-08-18 ENCOUNTER — Emergency Department (HOSPITAL_COMMUNITY): Payer: BC Managed Care – PPO

## 2021-08-18 ENCOUNTER — Emergency Department (HOSPITAL_COMMUNITY)
Admission: EM | Admit: 2021-08-18 | Discharge: 2021-08-18 | Disposition: A | Discharge status: Home / Self Care | Payer: BC Managed Care – PPO | Attending: Emergency Medicine | Admitting: Emergency Medicine

## 2021-08-18 DIAGNOSIS — R519 Headache, unspecified: Secondary | ICD-10-CM | POA: Insufficient documentation

## 2021-08-18 DIAGNOSIS — R072 Precordial pain: Secondary | ICD-10-CM | POA: Insufficient documentation

## 2021-08-18 DIAGNOSIS — R55 Syncope and collapse: Secondary | ICD-10-CM | POA: Insufficient documentation

## 2021-08-18 LAB — COMPREHENSIVE METABOLIC PANEL
ALT: 17 U/L (ref 0–44)
AST: 17 U/L (ref 15–41)
Albumin: 4.5 g/dL (ref 3.5–5.0)
Alkaline Phosphatase: 56 U/L (ref 38–126)
Anion gap: 4 — ABNORMAL LOW (ref 5–15)
BUN: 12 mg/dL (ref 6–20)
CO2: 25 mmol/L (ref 22–32)
Calcium: 9.3 mg/dL (ref 8.9–10.3)
Chloride: 109 mmol/L (ref 98–111)
Creatinine, Ser: 1.25 mg/dL — ABNORMAL HIGH (ref 0.61–1.24)
GFR, Estimated: >60 mL/min (ref >60)
Glucose, Bld: 90 mg/dL (ref 70–99)
Potassium: 4.3 mmol/L (ref 3.5–5.1)
Sodium: 138 mmol/L (ref 135–145)
Total Bilirubin: 0.7 mg/dL (ref 0.3–1.2)
Total Protein: 7.4 g/dL (ref 6.5–8.1)

## 2021-08-18 LAB — D-DIMER, QUANTITATIVE: D-Dimer, Quant: <0.27 ug/mL-FEU (ref 0.00–0.50)

## 2021-08-18 LAB — CBC WITH DIFFERENTIAL/PLATELET
Abs Immature Granulocytes: 0.01 10*3/uL (ref 0.00–0.07)
Basophils Absolute: 0 10*3/uL (ref 0.0–0.1)
Basophils Relative: 1 %
Eosinophils Absolute: 0.1 10*3/uL (ref 0.0–0.5)
Eosinophils Relative: 2 %
HCT: 42.8 % (ref 39.0–52.0)
Hemoglobin: 14.2 g/dL (ref 13.0–17.0)
Immature Granulocytes: 0 %
Lymphocytes Relative: 30 %
Lymphs Abs: 1.7 10*3/uL (ref 0.7–4.0)
MCH: 30.3 pg (ref 26.0–34.0)
MCHC: 33.2 g/dL (ref 30.0–36.0)
MCV: 91.3 fL (ref 80.0–100.0)
Monocytes Absolute: 0.5 10*3/uL (ref 0.1–1.0)
Monocytes Relative: 9 %
Neutro Abs: 3.3 10*3/uL (ref 1.7–7.7)
Neutrophils Relative %: 58 %
Platelets: 182 10*3/uL (ref 150–400)
RBC: 4.69 MIL/uL (ref 4.22–5.81)
RDW: 11.2 % — ABNORMAL LOW (ref 11.5–15.5)
WBC: 5.7 10*3/uL (ref 4.0–10.5)
nRBC: 0 % (ref 0.0–0.2)

## 2021-08-18 LAB — TROPONIN I (HIGH SENSITIVITY)
Troponin I (High Sensitivity): 3 ng/L (ref <18)
Troponin I (High Sensitivity): 3 ng/L (ref <18)

## 2021-08-18 NOTE — ED Triage Notes (Signed)
Pt works night shift, came home feeling well this morning and slept. Then he woke up this morning and ate lunch, felt lightheaded and disoriented, and his girlfriend said he passed out x 15 seconds. Pt reports feeling weak still.

## 2021-08-19 ENCOUNTER — Telehealth (HOSPITAL_COMMUNITY): Payer: Self-pay

## 2021-08-19 NOTE — Telephone Encounter (Signed)
Pt requesting work note from visit on 5/28. Work note printed per pt request.

## 2021-08-25 IMAGING — DX DG HIP (WITH OR WITHOUT PELVIS) 2-3V*L*
3 series · 3 of 3 positions shown · non-contrast
Comparison: None.

CLINICAL DATA: Left hip pain

EXAM:
DG HIP (WITH OR WITHOUT PELVIS) 2-3V LEFT

[pelvis ap]
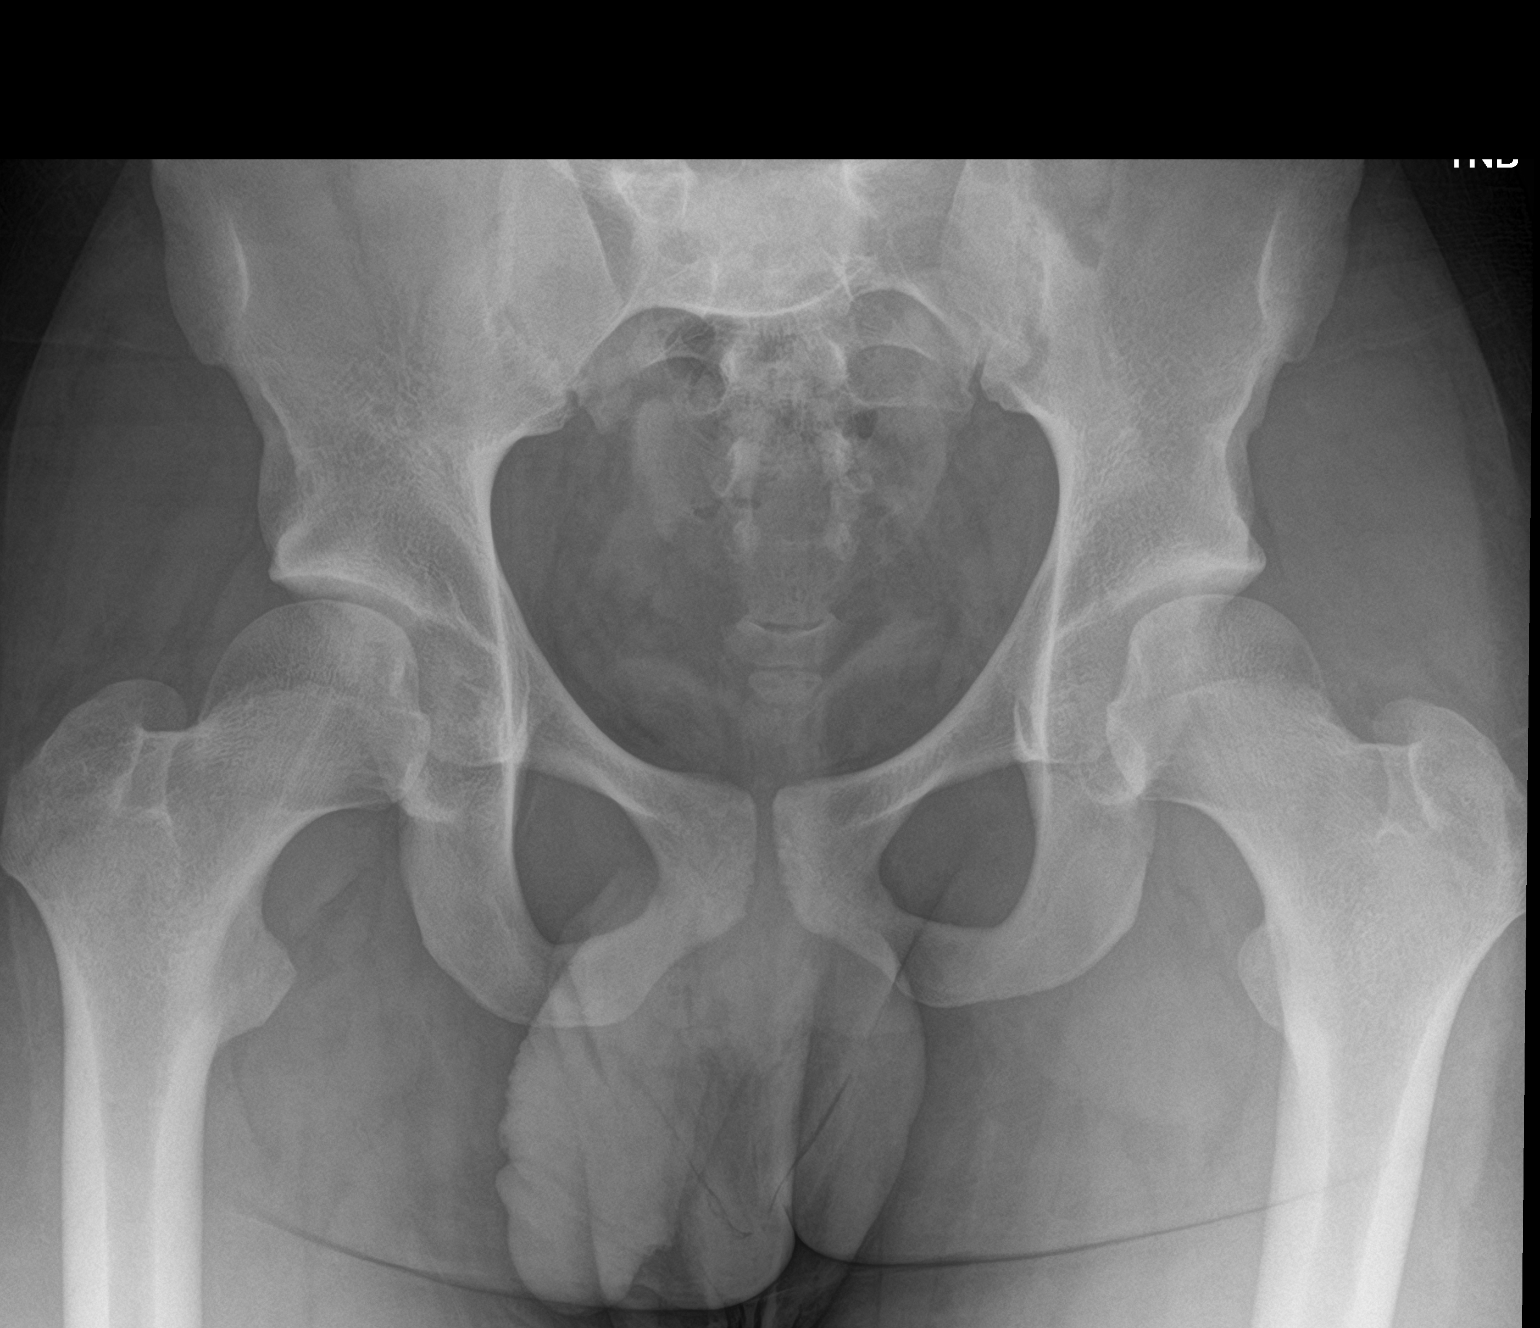

[hip ap]
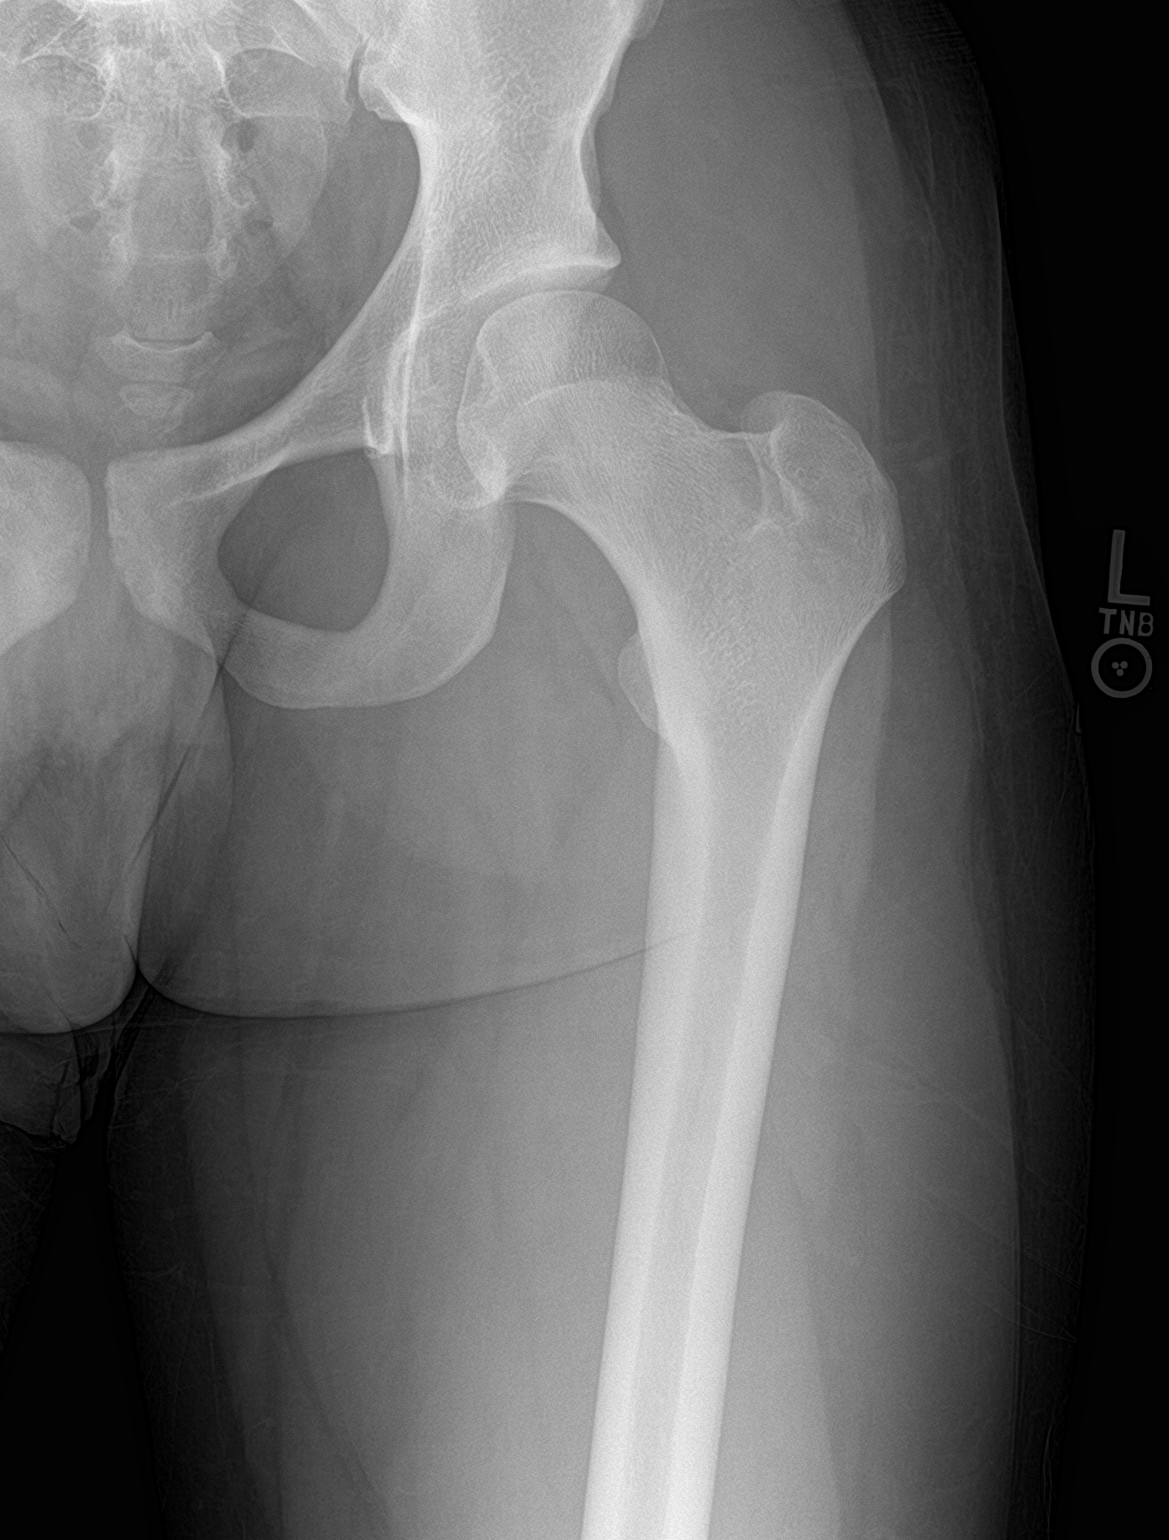

[hip lat]
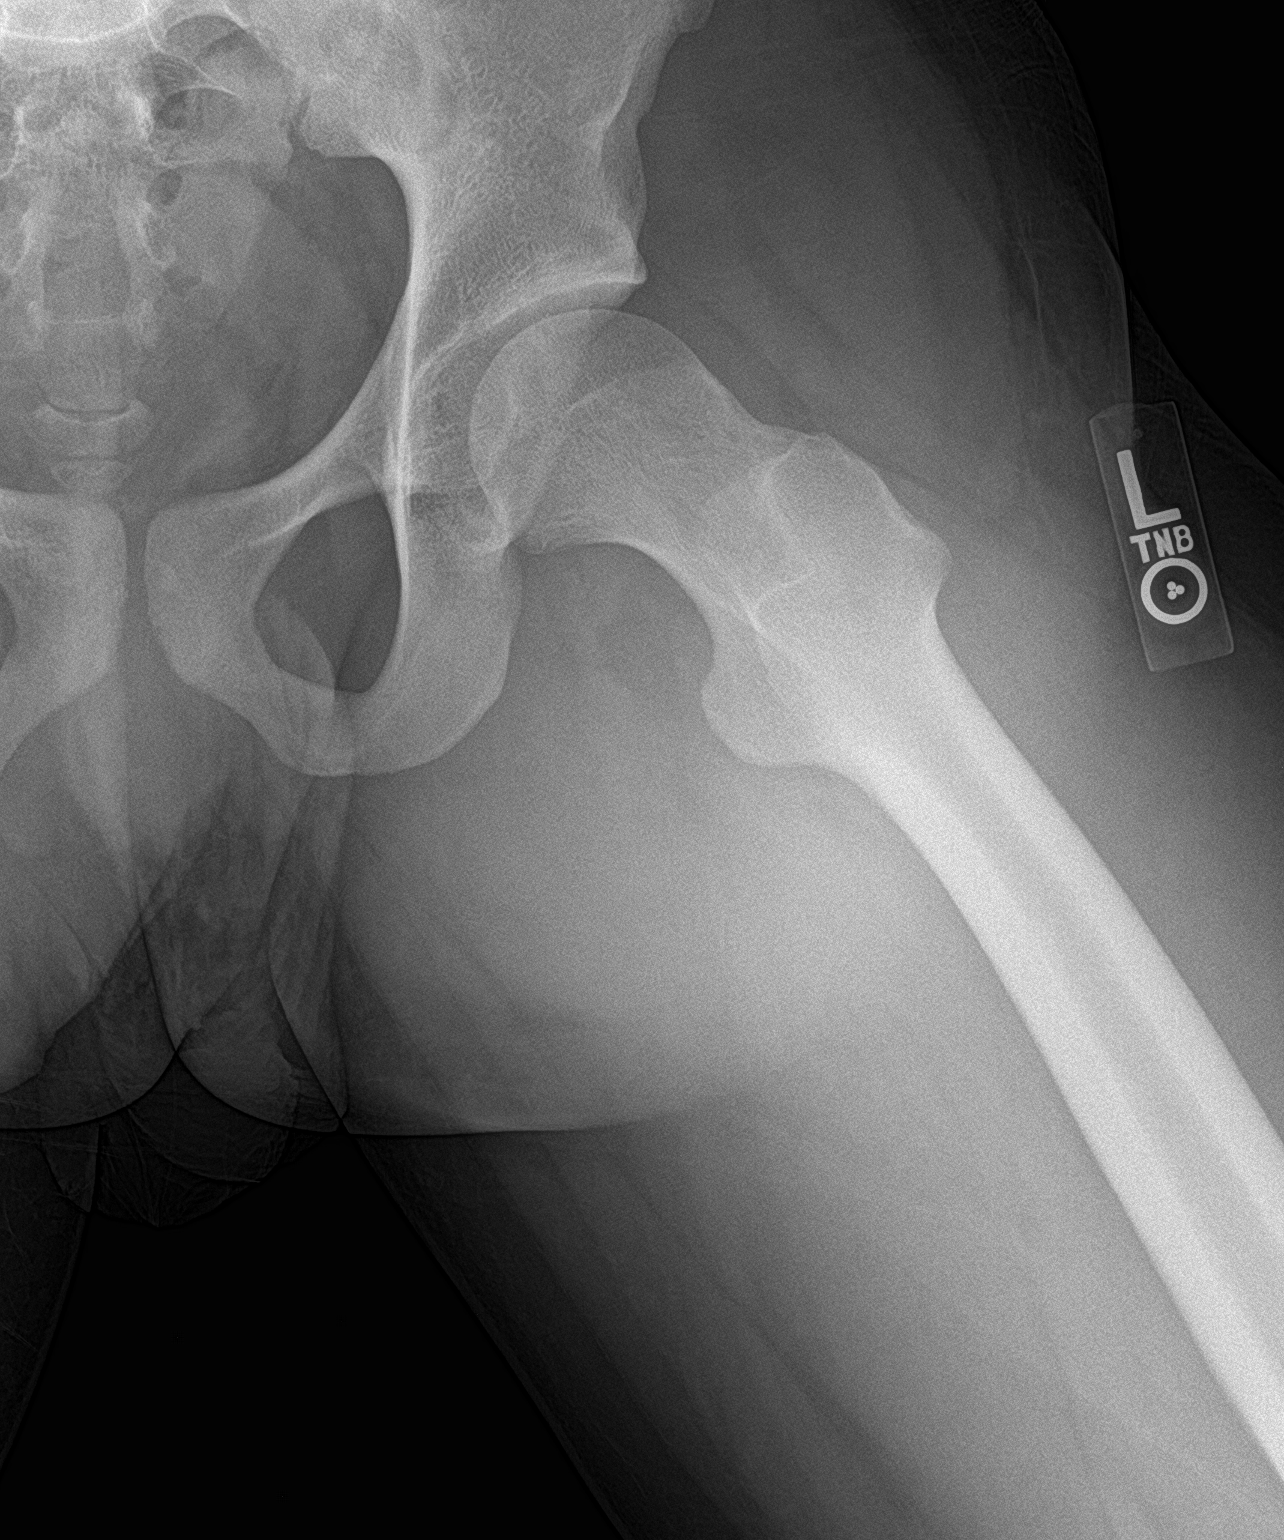

[3 of 3 positions shown; findings below may reference images not displayed]

FINDINGS: There is no evidence of hip fracture or dislocation. There is no
evidence of arthropathy or other focal bone abnormality.
IMPRESSION: Negative.

## 2021-08-28 DIAGNOSIS — R55 Syncope and collapse: Secondary | ICD-10-CM

## 2021-08-28 HISTORY — DX: Syncope and collapse: R55

## 2021-08-28 NOTE — Progress Notes (Signed)
Cardiology Office Note   Date:  08/29/2021   ID:  Austin Perkins, DOB 2000-03-08, MRN [id number]  PCP:  Austin Canary, Perkins  Cardiologist:   None Referring:   Austin Perkins  Chief Complaint  Patient presents with   Dizziness      History of Present Illness: Austin Perkins is a 21 y.o. male who presents for evaluation of syncope and collapse.  He was referred by Austin Canary, Perkins.  He was seen in the ED late last month for this.  I reviewed these records for this visit.    He had a mildly elevated creatinine.  EKG demonstrated LVH with repolarization.   He has not had prior cardiac work up.    He said that the day that this happened he had been having some symptoms of possibly an upper respiratory infection not feeling great.  He got off of work.  He woke up and ate some food.  He was seated and he stood up and got lightheaded and he told his fiance that he was going to pass out.  He went back down to the couch for a few seconds.  He recovered quickly.  He did not have any palpitations.  He did not have any chest pressure, neck or arm discomfort.  He did not have any shortness of breath.  There was no loss of bowel or bladder.  He is otherwise not had any cardiac symptoms.  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  He never had any prior cardiac work-up.  He did not have any chest pressure, neck or arm discomfort.  He has felt well since then.  Of note he was told when he was in the [employer] and hypertension.  He has been on amlodipine for few weeks.  His blood pressure is elevated as reported below.  He has never had any work-up of this.  He says his father has hypertension.   Past Medical History:  Diagnosis Date   Shoulder dislocation     Past Surgical History:  Procedure Laterality Date   WISDOM TOOTH EXTRACTION       Current Outpatient Medications  Medication Sig Dispense Refill    albuterol (VENTOLIN HFA) 108 (90 Base) MCG/ACT inhaler Inhale into the lungs as needed.     amLODipine-benazepril (LOTREL) 5-20 MG capsule Take 1 capsule by mouth daily. 90 capsule 3   cetirizine (ZYRTEC) 10 MG tablet Take 1 tablet (10 mg total) by mouth daily. 30 tablet 2   fluticasone (FLONASE) 50 MCG/ACT nasal spray Place 1 spray into both nostrils daily. 16 g 2   ibuprofen (ADVIL) 800 MG tablet Take 1 tablet (800 mg total) by mouth 3 (three) times daily. 21 tablet 0   No current facility-administered medications for this visit.    Allergies:   Amoxicillin, Azithromycin, and Primaquine    Social History:  The patient  reports that he has never smoked. He has never used smokeless tobacco. He reports that he does not drink alcohol and does not use drugs.   Family History:  The patient's family history includes Healthy in his mother; Hypertension in his father.    ROS:  Please see the history of present illness.   Otherwise, review of systems are positive for none.   All other systems are reviewed and negative.    PHYSICAL EXAM: VS:  BP (!) 160/89  Pulse (!) 54   Ht '5\' 11"'$  (1.803 m)   Wt 185 lb 12.8 oz (84.3 kg)   SpO2 99%   BMI 25.91 kg/m  , BMI Body mass index is 25.91 kg/m.  (Blood pressures were checked in both arms ) GENERAL:  Well appearing HEENT:  Pupils equal round and reactive, fundi not visualized, oral mucosa unremarkable NECK:  No jugular venous distention, waveform within normal limits, carotid upstroke brisk and symmetric, no bruits, no thyromegaly LYMPHATICS:  No cervical, inguinal adenopathy LUNGS:  Clear to auscultation bilaterally BACK:  No CVA tenderness CHEST:  Unremarkable HEART:  PMI not displaced or sustained,S1 and S2 within normal limits, no S3, no S4, no clicks, no rubs, no murmurs ABD:  Flat, positive bowel sounds normal in frequency in pitch, no bruits, no rebound, no guarding, no midline pulsatile mass, no hepatomegaly, no splenomegaly EXT:  2  plus pulses throughout, no edema, no cyanosis no clubbing SKIN:  No rashes no nodules NEURO:  Cranial nerves II through XII grossly intact, motor grossly intact throughout PSYCH:  Cognitively intact, oriented to person place and time    EKG:  EKG is ordered today. The ekg ordered today demonstrates sinus rhythm, rate 54, axis within normal limits, intervals within normal limits, early repolarization pattern.  This was not different from 2018.   Recent Labs: 08/18/2021: ALT 17; BUN 12; Creatinine, Ser 1.25; Hemoglobin 14.2; Platelets 182; Potassium 4.3; Sodium 138    Lipid Panel No results found for: "CHOL", "TRIG", "HDL", "CHOLHDL", "VLDL", "LDLCALC", "LDLDIRECT"    Wt Readings from Last 3 Encounters:  08/29/21 185 lb 12.8 oz (84.3 kg)  12/13/19 179 lb 14.3 oz (81.6 kg)  09/03/18 180 lb (81.6 kg) (83 %, Z= 0.97)*   * Growth percentiles are based on CDC (Boys, 2-20 Years) data.      Other studies Reviewed: Additional studies/ records that were reviewed today include: ED records. Review of the above records demonstrates:  Please see elsewhere in the note.     ASSESSMENT AND PLAN:  Dizziness:   Had a brief episode of lightheadedness without clear syncope.  This was clearly related to a change in position and he had not been feeling well and likely was slightly dehydrated.  I suspect a transient orthostatic blood pressure drop.  He was not orthostatic in the office.   He has since felt well and has not had syncope or presyncope.   I will screen him with a 4-week monitor.   Hypertension: I am going to switch the patient to Lotrel 5/20.  He will stop the amlodipine.  I would like to check a UA.  I will set him up for follow-up with Austin Perkins in her [facility] Clinic as he will need work-up for secondary causes.  Current medicines are reviewed at length with the patient today.  The patient does not have concerns regarding medicines.  The following changes have been made: As  above  Labs/ tests ordered today include:   Orders Placed This Encounter  Procedures   Urinalysis   AMB REFERRAL TO [facility]   Cardiac event monitor   EKG 12-Lead     Disposition:   FU with Austin Perkins.      Signed, Austin Breeding, Perkins  08/29/2021 5:07 PM    [place]

## 2021-08-29 ENCOUNTER — Encounter: Payer: Self-pay | Admitting: Cardiology

## 2021-08-29 ENCOUNTER — Ambulatory Visit (INDEPENDENT_AMBULATORY_CARE_PROVIDER_SITE_OTHER): Payer: BC Managed Care – PPO | Admitting: Cardiology

## 2021-08-29 ENCOUNTER — Encounter: Payer: Self-pay | Admitting: Radiology

## 2021-08-29 VITALS — BP 160/89 | HR 54 | Ht 71.0 in | Wt 185.8 lb

## 2021-08-29 DIAGNOSIS — R55 Syncope and collapse: Secondary | ICD-10-CM | POA: Diagnosis not present

## 2021-08-29 DIAGNOSIS — I10 Essential (primary) hypertension: Secondary | ICD-10-CM

## 2021-08-29 DIAGNOSIS — R42 Dizziness and giddiness: Secondary | ICD-10-CM | POA: Insufficient documentation

## 2021-08-29 HISTORY — DX: Dizziness and giddiness: R42

## 2021-08-29 MED ORDER — AMLODIPINE BESY-BENAZEPRIL HCL 5-20 MG PO CAPS: 1.0000 | ORAL_CAPSULE | Freq: Every day | ORAL | 3 refills | Status: DC

## 2021-08-29 NOTE — Patient Instructions (Signed)
Medication Instructions:   STOP AMLODIPINE  START AMLODIPINE-BENAZEPRIL 5/20 MG ONCE DAILY  *If you need a refill on your cardiac medications before your next appointment, please call your pharmacy*   Testing/Procedures:  Your physician has recommended that you wear an event monitor. Event monitors are medical devices that record the heart's electrical activity. Doctors most often [place] these monitors to diagnose arrhythmias. Arrhythmias are problems with the speed or rhythm of the heartbeat. The monitor is a small, portable device. You can wear one while you do your normal daily activities. This is usually used to diagnose what is causing palpitations/syncope (passing out).    Follow-Up: At [facility], you and your health needs are our priority.  As part of our continuing mission to provide you with exceptional heart care, we have created designated Provider Care Teams.  These Care Teams include your primary Cardiologist (physician) and Advanced Practice Providers (APPs -  Physician Assistants and Nurse Practitioners) who all work together to provide you with the care you need, when you need it.  We recommend signing up for the patient portal called "MyChart".  Sign up information is provided on this After Visit Summary.  MyChart is used to connect with patients for Virtual Visits (Telemedicine).  Patients are able to view lab/test results, encounter notes, upcoming appointments, etc.  Non-urgent messages can be sent to your provider as well.   To learn more about what you can do with MyChart, go to [email or web address].    Your next appointment:    FOLLOW UP WITH DR [place] IN THE HTN CLINIC AT THE [facility]  Important Information About Sugar

## 2021-08-29 NOTE — Progress Notes (Signed)
Enrolled patient for a 30 day [employer] Event Monitor to be mailed to patients home  

## 2021-08-30 LAB — URINALYSIS
Bilirubin, UA: NEGATIVE
Glucose, UA: NEGATIVE
Ketones, UA: NEGATIVE
Leukocytes,UA: NEGATIVE
Nitrite, UA: NEGATIVE
Protein,UA: NEGATIVE
RBC, UA: NEGATIVE
Specific Gravity, UA: 1.022 (ref 1.005–1.030)
Urobilinogen, Ur: 0.2 mg/dL (ref 0.2–1.0)
pH, UA: 6.5 (ref 5.0–7.5)

## 2021-09-04 ENCOUNTER — Ambulatory Visit (INDEPENDENT_AMBULATORY_CARE_PROVIDER_SITE_OTHER): Payer: BC Managed Care – PPO

## 2021-09-04 ENCOUNTER — Encounter: Payer: Self-pay | Admitting: *Deleted

## 2021-09-04 DIAGNOSIS — R42 Dizziness and giddiness: Secondary | ICD-10-CM

## 2021-10-21 ENCOUNTER — Encounter: Payer: Self-pay | Admitting: *Deleted

## 2021-12-01 ENCOUNTER — Ambulatory Visit (HOSPITAL_BASED_OUTPATIENT_CLINIC_OR_DEPARTMENT_OTHER): Payer: BC Managed Care – PPO | Admitting: Cardiovascular Disease

## 2022-01-21 ENCOUNTER — Ambulatory Visit (HOSPITAL_COMMUNITY)
Admission: EM | Admit: 2022-01-21 | Discharge: 2022-01-21 | Disposition: A | Discharge status: Home / Self Care | Payer: Worker's Compensation | Attending: Internal Medicine | Admitting: Internal Medicine

## 2022-01-21 ENCOUNTER — Ambulatory Visit (INDEPENDENT_AMBULATORY_CARE_PROVIDER_SITE_OTHER): Payer: Worker's Compensation

## 2022-01-21 ENCOUNTER — Encounter (HOSPITAL_COMMUNITY): Payer: Self-pay

## 2022-01-21 ENCOUNTER — Ambulatory Visit (HOSPITAL_COMMUNITY): Payer: Medicaid Other

## 2022-01-21 DIAGNOSIS — S6992XA Unspecified injury of left wrist, hand and finger(s), initial encounter: Secondary | ICD-10-CM

## 2022-01-21 DIAGNOSIS — M79642 Pain in left hand: Secondary | ICD-10-CM

## 2022-01-21 NOTE — ED Triage Notes (Signed)
Pt injured left hand at work while trying to lift a box. Pt left hand is possible swollen and painful x3days

## 2022-01-21 NOTE — Discharge Instructions (Signed)
Your x-rays of your hand were negative for fracture or dislocation. You likely sprained your hand.   Please rest, ice, and elevate your hand to help it heal and decrease inflammation.   Take '600mg'$  ibuprofen every 6 hours or tylenol 1,000 every 6 hours as needed for pain. If needed, you can alternate these medications so that you take one medication every 3 hours. For instance, at noon take ibuprofen, then at 3pm take tylenol, then at 6pm take ibuprofen.   Call the orthopedic provider listed on your discharge paperwork to schedule a follow-up appointment if your symptoms do not improve in the next 1-2 weeks with supportive care.  Return to urgent care if you experience worsening pain, numbness, tingling, change of color in your skin near the injury, or any other concerning symptoms.  I hope you feel better!

## 2022-01-25 NOTE — ED Provider Notes (Signed)
[facility]    CSN: [id number] Arrival date & time: 01/21/22  1749      History   Chief Complaint Chief Complaint  Patient presents with   Hand Injury    HPI Austin Perkins is a 21 y.o. male.   Patient presents urgent care for evaluation of left hand pain at the left anatomical snuffbox that started 3 days ago after lifting a particularly heavy box.  Patient states he went to lift up the heavy box, was able to do so, but then experienced significant pain when he got home from work later that day.  Pain has persisted over the last 3 days to the point where movement makes the left hand very uncomfortable.  He believes he heard a pop to the left hand and would like to ensure that he did not break any bones.  He believes the left hand appears to be swelling.  Denies numbness and tingling to the bilateral upper extremities, weakness, previous injury to the left upper extremity, and wrist/elbow pain.  No attempted use of any over-the-counter medications prior to arrival urgent care for symptoms.   Hand Injury   Past Medical History:  Diagnosis Date   Shoulder dislocation     Patient Active Problem List   Diagnosis Date Noted   Dizziness 08/29/2021   Syncope and collapse 08/28/2021   Acute upper respiratory infection 12/13/2019   Primary hypertension 12/13/2019   Acromioclavicular sprain, right, initial encounter 12/16/2015    Past Surgical History:  Procedure Laterality Date   WISDOM TOOTH EXTRACTION         Home Medications    Prior to Admission medications   Medication Sig Start Date End Date Taking? Authorizing Provider  albuterol (VENTOLIN HFA) 108 (90 Base) MCG/ACT inhaler Inhale into the lungs as needed. 05/27/21 05/27/22  [provider]  amLODipine-benazepril (LOTREL) 5-20 MG capsule Take 1 capsule by mouth daily. 08/29/21   Minus Breeding, MD  cetirizine (ZYRTEC) 10 MG tablet Take 1 tablet (10 mg total) by mouth daily. 07/20/21   Talbot Grumbling, FNP  fluticasone (FLONASE) 50 MCG/ACT nasal spray Place 1 spray into both nostrils daily. 07/20/21   Talbot Grumbling, FNP  ibuprofen (ADVIL) 800 MG tablet Take 1 tablet (800 mg total) by mouth 3 (three) times daily. 07/20/21   Talbot Grumbling, FNP    Family History Family History  Problem Relation Age of Onset   Healthy Mother    Hypertension Father     Social History Social History   Tobacco Use   Smoking status: Never   Smokeless tobacco: Never  Vaping Use   Vaping Use: Never used  Substance Use Topics   Alcohol use: No   Drug use: No     Allergies   Amoxicillin, Azithromycin, and Primaquine   Review of Systems Review of Systems   Physical Exam Triage Vital Signs ED Triage Vitals  Enc Vitals Group     BP 01/21/22 1859 (!) 142/76     Pulse Rate 01/21/22 1859 60     Resp 01/21/22 1859 12     Temp 01/21/22 1859 98.2 F (36.8 C)     Temp Source 01/21/22 1859 Oral     SpO2 01/21/22 1859 97 %     Weight --      Height --      Head Circumference --      Peak Flow --      Pain Score 01/21/22 1857 8  Pain Loc --      Pain Edu? --      Excl. in [place]? --    No data found.  Updated Vital Signs BP (!) 142/76 (BP Location: Right Arm)   Pulse 60   Temp 98.2 F (36.8 C) (Oral)   Resp 12   SpO2 97%   Visual Acuity Right Eye Distance:   Left Eye Distance:   Bilateral Distance:    Right Eye Near:   Left Eye Near:    Bilateral Near:     Physical Exam Vitals and nursing note reviewed.  Constitutional:      Appearance: He is not ill-appearing or toxic-appearing.  HENT:     Head: Normocephalic and atraumatic.     Right Ear: Hearing and external ear normal.     Left Ear: Hearing and external ear normal.     Nose: Nose normal.     Mouth/Throat:     Lips: Pink.  Eyes:     General: Lids are normal. Vision grossly intact. Gaze aligned appropriately.     Extraocular Movements: Extraocular movements intact.     Conjunctiva/sclera:  Conjunctivae normal.  Pulmonary:     Effort: Pulmonary effort is normal.  Musculoskeletal:     Right wrist: Normal.     Left wrist: Normal.     Right hand: Normal.     Left hand: Tenderness present. No swelling, deformity, lacerations or bony tenderness. Decreased range of motion. Normal strength. Normal sensation. There is no disruption of two-point discrimination. Normal capillary refill. Normal pulse.     Cervical back: Neck supple.     Comments: Generalized decreased range of motion of the left hand, able to move all 5 fingers on left hand voluntarily without difficulty.  Strength intact to all digits of left hand with normal capillary refill and sensation.  +2 radial pulses bilaterally.  No swelling appreciated.  Skin:    General: Skin is warm and dry.     Capillary Refill: Capillary refill takes less than 2 seconds.     Findings: No rash.  Neurological:     General: No focal deficit present.     Mental Status: He is alert and oriented to person, place, and time. Mental status is at baseline.     Cranial Nerves: No dysarthria or facial asymmetry.  Psychiatric:        Mood and Affect: Mood normal.        Speech: Speech normal.        Behavior: Behavior normal.        Thought Content: Thought content normal.        Judgment: Judgment normal.      UC Treatments / Results  Labs (all labs ordered are listed, but only abnormal results are displayed) Labs Reviewed - No data to display  EKG   Radiology No results found.  Procedures Procedures (including critical care time)  Medications Ordered in UC Medications - No data to display  Initial Impression / Assessment and Plan / UC Course  I have reviewed the triage vital signs and the nursing notes.  Pertinent labs & imaging results that were available during my care of the patient were reviewed by me and considered in my medical decision making (see chart for details).   1.  Injury of left hand Imaging is negative for  acute fracture or dislocation.  Sprain is suspected.  RICE advised.  600 mg of ibuprofen every 6 hours or 1000 mg of Tylenol every 6  hours may be used as needed for pain and inflammation.  Walking referral to orthopedics provided should symptoms fail to improve in the next few days.  Work note given.   Discussed physical exam and available lab work findings in clinic with patient.  Counseled patient regarding appropriate use of medications and potential side effects for all medications recommended or prescribed today. Discussed red flag signs and symptoms of worsening condition,when to call the PCP office, return to urgent care, and when to seek higher level of care in the emergency department. Patient verbalizes understanding and agreement with plan. All questions answered. Patient discharged in stable condition.    Final Clinical Impressions(s) / UC Diagnoses   Final diagnoses:  Injury of left hand, initial encounter     Discharge Instructions      Your x-rays of your hand were negative for fracture or dislocation. You likely sprained your hand.   Please rest, ice, and elevate your hand to help it heal and decrease inflammation.   Take '600mg'$  ibuprofen every 6 hours or tylenol 1,000 every 6 hours as needed for pain. If needed, you can alternate these medications so that you take one medication every 3 hours. For instance, at noon take ibuprofen, then at 3pm take tylenol, then at 6pm take ibuprofen.   Call the orthopedic provider listed on your discharge paperwork to schedule a follow-up appointment if your symptoms do not improve in the next 1-2 weeks with supportive care.  Return to urgent care if you experience worsening pain, numbness, tingling, change of color in your skin near the injury, or any other concerning symptoms.  I hope you feel better!   ED Prescriptions   None    PDMP not reviewed this encounter.   Talbot Grumbling, [place] 01/25/22 2004

## 2022-09-28 ENCOUNTER — Encounter (HOSPITAL_COMMUNITY): Payer: Self-pay

## 2022-09-28 ENCOUNTER — Emergency Department (HOSPITAL_COMMUNITY)
Admission: EM | Admit: 2022-09-28 | Discharge: 2022-09-28 | Disposition: A | Discharge status: Home / Self Care | Payer: BC Managed Care – PPO | Attending: Emergency Medicine | Admitting: Emergency Medicine

## 2022-09-28 ENCOUNTER — Other Ambulatory Visit: Payer: Self-pay

## 2022-09-28 DIAGNOSIS — R519 Headache, unspecified: Secondary | ICD-10-CM | POA: Diagnosis present

## 2022-09-28 DIAGNOSIS — I10 Essential (primary) hypertension: Secondary | ICD-10-CM | POA: Diagnosis not present

## 2022-09-28 DIAGNOSIS — Z79899 Other long term (current) drug therapy: Secondary | ICD-10-CM | POA: Insufficient documentation

## 2022-09-28 DIAGNOSIS — U071 COVID-19: Secondary | ICD-10-CM | POA: Diagnosis not present

## 2022-09-28 LAB — SARS CORONAVIRUS 2 BY RT PCR: SARS Coronavirus 2 by RT PCR: POSITIVE — AB

## 2022-09-28 MED ORDER — ONDANSETRON 8 MG PO TBDP: 8.0000 mg | ORAL_TABLET | Freq: Three times a day (TID) | ORAL | 0 refills | Status: AC | PRN

## 2022-09-28 MED ORDER — IBUPROFEN 400 MG PO TABS
600.0000 mg | ORAL_TABLET | Freq: Once | ORAL | Status: AC
  Administered 2022-09-28: 600 mg via ORAL
  Filled 2022-09-28: qty 1

## 2022-09-28 NOTE — Discharge Instructions (Addendum)
Drink plenty of fluids and rest, take ibuprofen or Tylenol to help with aches and pains.

## 2022-09-28 NOTE — ED Provider Notes (Signed)
[place] EMERGENCY DEPARTMENT AT [facility] Provider Note   CSN: [id number] Arrival date & time: 09/28/22  1529     History  Chief complaint: COVID virus  Austin Perkins is a 22 y.o. male.  HPI   Patient has history of hypertension.  He presents to the ED for evaluation of covid.  Patient states he started having trouble with symptoms over the weekend.  He has had bodyaches headaches nausea lightheadedness.  Patient states he tested positive at home.  He came to the ED to confirm his diagnosis. Home Medications Prior to Admission medications   Medication Sig Start Date End Date Taking? Authorizing Provider  ondansetron (ZOFRAN-ODT) 8 MG disintegrating tablet Take 1 tablet (8 mg total) by mouth every 8 (eight) hours as needed for nausea or vomiting. 09/28/22  Yes Linwood Dibbles, MD  albuterol (VENTOLIN HFA) 108 (90 Base) MCG/ACT inhaler Inhale into the lungs as needed. 05/27/21 05/27/22  [provider]  amLODipine-benazepril (LOTREL) 5-20 MG capsule Take 1 capsule by mouth daily. 08/29/21   Rollene Rotunda, MD  cetirizine (ZYRTEC) 10 MG tablet Take 1 tablet (10 mg total) by mouth daily. 07/20/21   Carlisle Beers, FNP  fluticasone (FLONASE) 50 MCG/ACT nasal spray Place 1 spray into both nostrils daily. 07/20/21   Carlisle Beers, FNP  ibuprofen (ADVIL) 800 MG tablet Take 1 tablet (800 mg total) by mouth 3 (three) times daily. 07/20/21   Carlisle Beers, FNP      Allergies    Amoxicillin, Azithromycin, and Primaquine    Review of Systems   Review of Systems  Physical Exam Updated Vital Signs BP (!) 144/95 (BP Location: Left Arm)   Pulse 79   Temp 99.2 F (37.3 C) (Oral)   Resp 18   Ht 1.803 m (5\' 11" )   Wt 84.3 kg   SpO2 98%   BMI 25.92 kg/m  Physical Exam Vitals and nursing note reviewed.  Constitutional:      General: He is not in acute distress.    Appearance: He is well-developed.  HENT:     Head: Normocephalic and atraumatic.      Right Ear: External ear normal.     Left Ear: External ear normal.  Eyes:     General: No scleral icterus.       Right eye: No discharge.        Left eye: No discharge.     Conjunctiva/sclera: Conjunctivae normal.  Neck:     Trachea: No tracheal deviation.  Cardiovascular:     Rate and Rhythm: Normal rate and regular rhythm.  Pulmonary:     Effort: Pulmonary effort is normal. No respiratory distress.     Breath sounds: Normal breath sounds. No stridor. No wheezing or rales.  Abdominal:     General: Bowel sounds are normal. There is no distension.     Palpations: Abdomen is soft.     Tenderness: There is no abdominal tenderness. There is no guarding or rebound.  Musculoskeletal:        General: No tenderness or deformity.     Cervical back: Neck supple.  Skin:    General: Skin is warm and dry.     Findings: No rash.  Neurological:     General: No focal deficit present.     Mental Status: He is alert.     Cranial Nerves: No cranial nerve deficit, dysarthria or facial asymmetry.     Sensory: No sensory deficit.  Motor: No abnormal muscle tone or seizure activity.     Coordination: Coordination normal.  Psychiatric:        Mood and Affect: Mood normal.     ED Results / Procedures / Treatments   Labs (all labs ordered are listed, but only abnormal results are displayed) Labs Reviewed  SARS CORONAVIRUS 2 BY RT PCR - Abnormal; Notable for the following components:      Result Value   SARS Coronavirus 2 by RT PCR POSITIVE (*)    All other components within normal limits    EKG None  Radiology No results found.  Procedures Procedures    Medications Ordered in ED Medications  ibuprofen (ADVIL) tablet 600 mg (has no administration in time range)    ED Course/ Medical Decision Making/ A&P                                 Medical Decision Making  Patient presents ED with positive COVID test.  He is nontoxic and well-appearing.  No difficulty breathing.  Appears  appropriate for outpatient management        Final Clinical Impression(s) / ED Diagnoses Final diagnoses:  COVID-19 virus infection    Rx / DC Orders ED Discharge Orders          Ordered    ondansetron (ZOFRAN-ODT) 8 MG disintegrating tablet  Every 8 hours PRN        09/28/22 2223              Linwood Dibbles, MD 09/28/22 2223

## 2022-09-28 NOTE — ED Triage Notes (Signed)
Pt states got back from [employer] and was round someone COVID positive. PT states home test came back positive. Pt c/o bodyaches, HA, lightheaded, dizzy, nausea, weaknessx2d.

## 2022-12-27 ENCOUNTER — Encounter (HOSPITAL_COMMUNITY): Payer: Self-pay

## 2022-12-27 ENCOUNTER — Emergency Department (HOSPITAL_COMMUNITY)
Admission: EM | Admit: 2022-12-27 | Discharge: 2022-12-27 | Disposition: A | Discharge status: Home / Self Care | Payer: BC Managed Care – PPO | Attending: Emergency Medicine | Admitting: Emergency Medicine

## 2022-12-27 ENCOUNTER — Other Ambulatory Visit: Payer: Self-pay

## 2022-12-27 DIAGNOSIS — R0981 Nasal congestion: Secondary | ICD-10-CM | POA: Diagnosis present

## 2022-12-27 DIAGNOSIS — Z20822 Contact with and (suspected) exposure to covid-19: Secondary | ICD-10-CM | POA: Diagnosis not present

## 2022-12-27 DIAGNOSIS — B349 Viral infection, unspecified: Secondary | ICD-10-CM | POA: Diagnosis not present

## 2022-12-27 LAB — RESP PANEL BY RT-PCR (RSV, FLU A&B, COVID)  RVPGX2
Influenza A by PCR: NEGATIVE
Influenza B by PCR: NEGATIVE
Resp Syncytial Virus by PCR: NEGATIVE
SARS Coronavirus 2 by RT PCR: NEGATIVE

## 2022-12-27 MED ORDER — ACETAMINOPHEN 500 MG PO TABS
1000.0000 mg | ORAL_TABLET | Freq: Once | ORAL | Status: AC
  Administered 2022-12-27: 1000 mg via ORAL
  Filled 2022-12-27: qty 2

## 2022-12-27 MED ORDER — IBUPROFEN 400 MG PO TABS
400.0000 mg | ORAL_TABLET | Freq: Once | ORAL | Status: AC
  Administered 2022-12-27: 400 mg via ORAL
  Filled 2022-12-27: qty 1

## 2022-12-27 NOTE — Discharge Instructions (Addendum)
You can check your results of your viral swab in MyChart.  You can take Motrin and Tylenol at home.  Your symptoms should get better over the next 2 to 3 days.

## 2022-12-27 NOTE — ED Provider Notes (Signed)
[place] EMERGENCY DEPARTMENT AT [facility] Provider Note   CSN: [id number] Arrival date & time: 12/27/22  1815     History  Chief Complaint  Patient presents with   Weakness    EDWARDS MCKELVIE is a 22 y.o. male.  This is a 22 year old male who is here today for nasal congestion, sore throat, fatigue, myalgias.  Symptoms began yesterday.   Weakness      Home Medications Prior to Admission medications   Medication Sig Start Date End Date Taking? Authorizing Provider  albuterol (VENTOLIN HFA) 108 (90 Base) MCG/ACT inhaler Inhale into the lungs as needed. 05/27/21 05/27/22  [provider]  amLODipine-benazepril (LOTREL) 5-20 MG capsule Take 1 capsule by mouth daily. 08/29/21   Rollene Rotunda, MD  cetirizine (ZYRTEC) 10 MG tablet Take 1 tablet (10 mg total) by mouth daily. 07/20/21   Carlisle Beers, FNP  fluticasone (FLONASE) 50 MCG/ACT nasal spray Place 1 spray into both nostrils daily. 07/20/21   Carlisle Beers, FNP  ibuprofen (ADVIL) 800 MG tablet Take 1 tablet (800 mg total) by mouth 3 (three) times daily. 07/20/21   Carlisle Beers, FNP  ondansetron (ZOFRAN-ODT) 8 MG disintegrating tablet Take 1 tablet (8 mg total) by mouth every 8 (eight) hours as needed for nausea or vomiting. 09/28/22   Linwood Dibbles, MD      Allergies    Amoxicillin, Azithromycin, and Primaquine    Review of Systems   Review of Systems  Neurological:  Positive for weakness.    Physical Exam Updated Vital Signs BP (!) 140/64   Pulse 76   Temp 99.3 F (37.4 C) (Oral)   Resp 19   Ht 5\' 11"  (1.803 m)   Wt 90.7 kg   SpO2 100%   BMI 27.89 kg/m  Physical Exam Vitals reviewed.  Constitutional:      Appearance: He is not ill-appearing.  HENT:     Head: Normocephalic.     Nose: Congestion present.     Mouth/Throat:     Mouth: Mucous membranes are moist.     Pharynx: No oropharyngeal exudate or posterior oropharyngeal erythema.  Eyes:     Pupils: Pupils are  equal, round, and reactive to light.  Cardiovascular:     Rate and Rhythm: Normal rate.  Pulmonary:     Effort: Pulmonary effort is normal. No respiratory distress.     Breath sounds: No wheezing.  Abdominal:     General: There is no distension.  Skin:    General: Skin is warm and dry.     Findings: No rash.  Neurological:     Mental Status: He is alert.     ED Results / Procedures / Treatments   Labs (all labs ordered are listed, but only abnormal results are displayed) Labs Reviewed  RESP PANEL BY RT-PCR (RSV, FLU A&B, COVID)  RVPGX2    EKG None  Radiology No results found.  Procedures Procedures    Medications Ordered in ED Medications  ibuprofen (ADVIL) tablet 400 mg (400 mg Oral Given 12/27/22 1857)  acetaminophen (TYLENOL) tablet 1,000 mg (1,000 mg Oral Given 12/27/22 1856)    ED Course/ Medical Decision Making/ A&P                                 Medical Decision Making 22 year old male here today for URI symptoms.  Differential diagnose include viral URI, less likely strep, less likely  pneumonia.  Plan-patient looks well.  I considered blood work on the patient, however this is likely viral URI.  Also considered chest x-ray and the patient, however given his reassuring exam and history do not believe it was indicated.  Provided Motrin and Tylenol here in the emergency department.  Counseled the patient on typical time course of viral syndrome.  Will discharge home.  My independent review of the patient's EKG shows no ST segment depressions or elevations, no T wave versions, no evidence of acute ischemia.  Risk OTC drugs. Prescription drug management.           Final Clinical Impression(s) / ED Diagnoses Final diagnoses:  None    Rx / DC Orders ED Discharge Orders     None         Arletha Pili, DO 12/27/22 2002

## 2022-12-27 NOTE — ED Triage Notes (Addendum)
Pt arrived POV from home c/o of not feeling good/right. Pt states he feels weak and disoriented, and his lower back is killing him and he feels congested.

## 2023-12-11 ENCOUNTER — Encounter (HOSPITAL_COMMUNITY): Payer: Self-pay

## 2023-12-11 ENCOUNTER — Emergency Department (HOSPITAL_COMMUNITY)
Admission: EM | Admit: 2023-12-11 | Discharge: 2023-12-11 | Disposition: A | Discharge status: Home / Self Care | Attending: Emergency Medicine | Admitting: Emergency Medicine

## 2023-12-11 ENCOUNTER — Other Ambulatory Visit: Payer: Self-pay

## 2023-12-11 DIAGNOSIS — X111XXA Contact with running hot water, initial encounter: Secondary | ICD-10-CM | POA: Diagnosis not present

## 2023-12-11 DIAGNOSIS — T25221A Burn of second degree of right foot, initial encounter: Secondary | ICD-10-CM | POA: Diagnosis present

## 2023-12-11 DIAGNOSIS — T310 Burns involving less than 10% of body surface: Secondary | ICD-10-CM | POA: Insufficient documentation

## 2023-12-11 MED ORDER — HYDROCODONE-ACETAMINOPHEN 5-325 MG PO TABS: 1.0000 | ORAL_TABLET | Freq: Four times a day (QID) | ORAL | 0 refills | Status: AC | PRN

## 2023-12-11 MED ORDER — SILVER SULFADIAZINE 1 % EX CREA: 1.0000 | TOPICAL_CREAM | Freq: Every day | CUTANEOUS | 0 refills | Status: AC
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
## 2023-12-11 NOTE — Discharge Instructions (Addendum)
 Please use Tylenol  or ibuprofen  for pain.  You may use 600 mg ibuprofen  every 6 hours or 1000 mg of Tylenol  every 6 hours.  You may choose to alternate between the 2.  This would be most effective.  Not to exceed 4 g of Tylenol  within 24 hours.  Not to exceed 3200 mg ibuprofen  24 hours.  You can use the stronger narcotic pain medication in place of Tylenol  for severe break through pain.  If you take the narcotic pain medication that we prescribed recommend that you also take a laxative such as MiraLAX or Dulcolax every day that you take the narcotic pain medicine, and drink plenty of fluids, 50 to 64 ounces to prevent any constipation.  Keep the wound covered, you can apply the cream that I have prescribed, keep it dressed with gauze.  It may help to keep the foot elevated and apply ice over top of the dressing to help with the soft tissue swelling and pain.
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
## 2023-12-14 ENCOUNTER — Telehealth: Payer: Self-pay

## 2023-12-14 NOTE — Telephone Encounter (Signed)
 Pt called to add a day to his work note so his time off would be covered by short term leave.

## 2024-02-29 ENCOUNTER — Encounter (HOSPITAL_COMMUNITY): Payer: Self-pay

## 2024-02-29 ENCOUNTER — Other Ambulatory Visit: Payer: Self-pay

## 2024-02-29 ENCOUNTER — Emergency Department (HOSPITAL_COMMUNITY)
Admission: EM | Admit: 2024-02-29 | Discharge: 2024-03-01 | Disposition: A | Discharge status: Home / Self Care | Attending: Emergency Medicine | Admitting: Emergency Medicine

## 2024-02-29 DIAGNOSIS — M545 Low back pain, unspecified: Secondary | ICD-10-CM | POA: Diagnosis present

## 2024-02-29 DIAGNOSIS — Z79899 Other long term (current) drug therapy: Secondary | ICD-10-CM | POA: Insufficient documentation

## 2024-02-29 DIAGNOSIS — J111 Influenza due to unidentified influenza virus with other respiratory manifestations: Secondary | ICD-10-CM

## 2024-02-29 DIAGNOSIS — I10 Essential (primary) hypertension: Secondary | ICD-10-CM | POA: Insufficient documentation

## 2024-02-29 DIAGNOSIS — R9431 Abnormal electrocardiogram [ECG] [EKG]: Secondary | ICD-10-CM | POA: Diagnosis not present

## 2024-02-29 DIAGNOSIS — J101 Influenza due to other identified influenza virus with other respiratory manifestations: Secondary | ICD-10-CM | POA: Insufficient documentation

## 2024-02-29 DIAGNOSIS — D72829 Elevated white blood cell count, unspecified: Secondary | ICD-10-CM | POA: Insufficient documentation

## 2024-02-29 LAB — COMPREHENSIVE METABOLIC PANEL WITH GFR
ALT: 23 U/L (ref 0–44)
AST: 22 U/L (ref 15–41)
Albumin: 4.9 g/dL (ref 3.5–5.0)
Alkaline Phosphatase: 83 U/L (ref 38–126)
Anion gap: 12 (ref 5–15)
BUN: 12 mg/dL (ref 6–20)
CO2: 26 mmol/L (ref 22–32)
Calcium: 10.1 mg/dL (ref 8.9–10.3)
Chloride: 96 mmol/L — ABNORMAL LOW (ref 98–111)
Creatinine, Ser: 1.15 mg/dL (ref 0.61–1.24)
GFR, Estimated: >60 mL/min
Glucose, Bld: 114 mg/dL — ABNORMAL HIGH (ref 70–99)
Potassium: 4.4 mmol/L (ref 3.5–5.1)
Sodium: 134 mmol/L — ABNORMAL LOW (ref 135–145)
Total Bilirubin: 0.4 mg/dL (ref 0.0–1.2)
Total Protein: 8.2 g/dL — ABNORMAL HIGH (ref 6.5–8.1)

## 2024-02-29 LAB — CBG MONITORING, ED: Glucose-Capillary: 121 mg/dL — ABNORMAL HIGH (ref 70–99)

## 2024-02-29 MED ORDER — ACETAMINOPHEN 325 MG PO TABS
975.0000 mg | ORAL_TABLET | Freq: Once | ORAL | Status: AC
  Administered 2024-03-01: 975 mg via ORAL
  Filled 2024-02-29: qty 3

## 2024-02-29 NOTE — ED Triage Notes (Signed)
 Patient reports lower back pain and tingling in his lower back. States it started as pain and now he feels fatigue like he may pass out. Denies issues with urination. States this has not happened before. Patient was able to ambulate to triage, can move all 4 extremities, denies, blurred vision, headache changes in speech.

## 2024-02-29 NOTE — ED Provider Triage Note (Signed)
 Emergency Medicine Provider Triage Evaluation Note  Austin Perkins , a 24 y.o. male  was evaluated in triage.  Pt complains of low back pain started today. No saddle paresthesia, urinary incontinence. No urinary sx  No ivdu, hx of hiv, nor cancer. No steroids on daily basis  Review of Systems  Positive: See hpi Negative:   Physical Exam  BP (!) 160/84 (BP Location: Right Arm)   Pulse (!) 121   Temp (!) 102.4 F (39.1 C)   Resp 20   Ht 5' 11 (1.803 m)   Wt 93 kg   SpO2 100%   BMI 28.59 kg/m  Gen:   Awake, no distress   Resp:  Normal effort  MSK:   Moves extremities without difficulty. Ttp of lumbar spine and bilateral paraspinous musculature Other:    Medical Decision Making  Medically screening exam initiated at 11:55 PM.  Appropriate orders placed.  Austin Perkins was informed that the remainder of the evaluation will be completed by another provider, this initial triage assessment does not replace that evaluation, and the importance of remaining in the ED until their evaluation is complete.  Noted to have fever , tachycardia - has had a cough, decreased appetite. No known sick contact. Will order resp panel. Tylenol  ordered.   Also ordered troponin for inverted t waves in inferolateral leads. Had inverted t waves in III and AVF in 2024. Evaluated by cards in 2023 for syncope. No PE RF  Renal study to ensure no stone, uti as etiology although likely muscle strain   Austin Perkins, Austin Perkins 03/01/24 0001

## 2024-03-01 ENCOUNTER — Telehealth: Payer: Self-pay

## 2024-03-01 ENCOUNTER — Emergency Department (HOSPITAL_COMMUNITY)

## 2024-03-01 LAB — CBC
HCT: 44.4 % (ref 39.0–52.0)
Hemoglobin: 14.4 g/dL (ref 13.0–17.0)
MCH: 29.6 pg (ref 26.0–34.0)
MCHC: 32.4 g/dL (ref 30.0–36.0)
MCV: 91.2 fL (ref 80.0–100.0)
Platelets: 177 K/uL (ref 150–400)
RBC: 4.87 MIL/uL (ref 4.22–5.81)
RDW: 11.4 % — ABNORMAL LOW (ref 11.5–15.5)
WBC: 14.5 K/uL — ABNORMAL HIGH (ref 4.0–10.5)
nRBC: 0 % (ref 0.0–0.2)

## 2024-03-01 LAB — URINALYSIS, ROUTINE W REFLEX MICROSCOPIC
Bilirubin Urine: NEGATIVE
Glucose, UA: NEGATIVE mg/dL
Hgb urine dipstick: NEGATIVE
Ketones, ur: NEGATIVE mg/dL
Leukocytes,Ua: NEGATIVE
Nitrite: NEGATIVE
Protein, ur: NEGATIVE mg/dL
Specific Gravity, Urine: 1.025 (ref 1.005–1.030)
pH: 8 (ref 5.0–8.0)

## 2024-03-01 LAB — TROPONIN T, HIGH SENSITIVITY
Troponin T High Sensitivity: <15 ng/L (ref 0–19)
Troponin T High Sensitivity: <15 ng/L (ref 0–19)

## 2024-03-01 LAB — RESP PANEL BY RT-PCR (RSV, FLU A&B, COVID)  RVPGX2
Influenza A by PCR: POSITIVE — AB
Influenza B by PCR: NEGATIVE
Resp Syncytial Virus by PCR: NEGATIVE
SARS Coronavirus 2 by RT PCR: NEGATIVE

## 2024-03-01 NOTE — Telephone Encounter (Signed)
 Patient requested work excuse note from ED visit on 1/6. Sent via email to [email or web address].   Merilee Batty, MSN, RN Case Management [phone]

## 2024-03-01 NOTE — Discharge Instructions (Signed)
 Thank for letting us  evaluate you today.  You tested positive for influenza.  You may use Tylenol , Motrin  intermittently before hours as needed for body aches, fever.  Your CT scan did not show any UTI nor kidney stone nor any other abnormalities in your belly.  Your troponin which is your heart enzyme was negative.  However, want you to follow-up with cardiology regarding abnormal EKG.  I have sent a referral and they should call you in the next week however if they do not please call them.  Return to Emergency Department if you experience chest pain, shortness of breath, worsening symptoms

## 2024-03-01 NOTE — ED Provider Notes (Signed)
 " [facility] Provider Note   CSN: [id number] Arrival date & time: 02/29/24  2302     Patient presents with: Back Pain   Austin Perkins is a 24 y.o. male with a past medical history of HTN, syncope presents to emergency department for evaluation of lumbar back pain, fatigue, decreased appetite that started today.  No history of IVDU, HIV, nor cancer.  Denies saddle paresthesia, urinary incontinence, urinary symptoms, lower extremity paresthesia nor weakness    Back Pain      Prior to Admission medications  Medication Sig Start Date End Date Taking? Authorizing Provider  albuterol (VENTOLIN HFA) 108 (90 Base) MCG/ACT inhaler Inhale into the lungs as needed. 05/27/21 05/27/22  [provider]  amLODipine -benazepril  (LOTREL) 5-20 MG capsule Take 1 capsule by mouth daily. 08/29/21   Lavona Agent, MD  cetirizine  (ZYRTEC ) 10 MG tablet Take 1 tablet (10 mg total) by mouth daily. 07/20/21   Enedelia Dorna HERO, FNP  fluticasone  (FLONASE ) 50 MCG/ACT nasal spray Place 1 spray into both nostrils daily. 07/20/21   Enedelia Dorna HERO, FNP  HYDROcodone -acetaminophen  (NORCO/VICODIN) 5-325 MG tablet Take 1 tablet by mouth every 6 (six) hours as needed. 12/11/23   Prosperi, Christian H, PA-C  ibuprofen  (ADVIL ) 800 MG tablet Take 1 tablet (800 mg total) by mouth 3 (three) times daily. 07/20/21   Enedelia Dorna HERO, FNP  ondansetron  (ZOFRAN -ODT) 8 MG disintegrating tablet Take 1 tablet (8 mg total) by mouth every 8 (eight) hours as needed for nausea or vomiting. 09/28/22   Randol Simmonds, MD  silver  sulfADIAZINE  (SILVADENE ) 1 % cream Apply 1 Application topically daily. 12/11/23   Prosperi, Christian H, PA-C    Allergies: Amoxicillin, Azithromycin , and Primaquine    Review of Systems  Musculoskeletal:  Positive for back pain.    Updated Vital Signs BP (!) 141/79 (BP Location: Right Arm)   Pulse 70   Temp 98.7 F (37.1 C) (Oral)   Resp 18   Ht 5'  11 (1.803 m)   Wt 93 kg   SpO2 100%   BMI 28.59 kg/m   Physical Exam Vitals and nursing note reviewed.  Constitutional:      General: He is not in acute distress.    Appearance: Normal appearance. He is not ill-appearing.  HENT:     Head: Normocephalic and atraumatic.  Eyes:     General: Lids are normal. Vision grossly intact. No visual field deficit.    Extraocular Movements: Extraocular movements intact.     Right eye: Normal extraocular motion and no nystagmus.     Left eye: Normal extraocular motion and no nystagmus.     Conjunctiva/sclera: Conjunctivae normal.     Pupils: Pupils are equal, round, and reactive to light.  Cardiovascular:     Rate and Rhythm: Tachycardia present.     Heart sounds: Normal heart sounds.  Pulmonary:     Effort: Pulmonary effort is normal. No respiratory distress.     Breath sounds: Normal breath sounds.  Abdominal:     General: Bowel sounds are normal. There is no distension.     Palpations: Abdomen is soft.     Tenderness: There is no abdominal tenderness. There is no guarding or rebound.  Musculoskeletal:     Cervical back: Normal range of motion and neck supple. No rigidity.     Right lower leg: No edema.     Left lower leg: No edema.  Skin:    Coloration: Skin is  not jaundiced or pale.  Neurological:     General: No focal deficit present.     Mental Status: He is alert and oriented to person, place, and time. Mental status is at baseline.     GCS: GCS eye subscore is 4. GCS verbal subscore is 5. GCS motor subscore is 6.     Cranial Nerves: No cranial nerve deficit, dysarthria or facial asymmetry.     Sensory: No sensory deficit.     Motor: No weakness, abnormal muscle tone, seizure activity or pronator drift.     Coordination: Coordination normal. Finger-Nose-Finger Test and Heel to [facility] Test normal.     Gait: Gait normal.     Deep Tendon Reflexes: Reflexes normal.     Comments: Motor 5/5 and sensation 2/2 BUE and BLE.  No speech nor  visual deficits.     (all labs ordered are listed, but only abnormal results are displayed) Labs Reviewed  RESP PANEL BY RT-PCR (RSV, FLU A&B, COVID)  RVPGX2 - Abnormal; Notable for the following components:      Result Value   Influenza A by PCR POSITIVE (*)    All other components within normal limits  COMPREHENSIVE METABOLIC PANEL WITH GFR - Abnormal; Notable for the following components:   Sodium 134 (*)    Chloride 96 (*)    Glucose, Bld 114 (*)    Total Protein 8.2 (*)    All other components within normal limits  CBC - Abnormal; Notable for the following components:   WBC 14.5 (*)    RDW 11.4 (*)    All other components within normal limits  CBG MONITORING, ED - Abnormal; Notable for the following components:   Glucose-Capillary 121 (*)    All other components within normal limits  URINALYSIS, ROUTINE W REFLEX MICROSCOPIC  TROPONIN T, HIGH SENSITIVITY  TROPONIN T, HIGH SENSITIVITY    EKG: None  Radiology: CT Renal Stone Study Result Date: 03/01/2024 EXAM: CT ABDOMEN AND PELVIS WITHOUT CONTRAST 03/01/2024 01:12:45 AM TECHNIQUE: CT of the abdomen and pelvis was performed without the administration of intravenous contrast. Multiplanar reformatted images are provided for review. Automated exposure control, iterative reconstruction, and/or weight-based adjustment of the mA/kV was utilized to reduce the radiation dose to as low as reasonably achievable. COMPARISON: None available. CLINICAL HISTORY: Abdominal/flank pain, stone suspected. FINDINGS: LOWER CHEST: Lung bases are clear. LIVER: The liver is unremarkable. GALLBLADDER AND BILE DUCTS: Gallbladder is unremarkable. No biliary ductal dilatation. SPLEEN: No acute abnormality. PANCREAS: No acute abnormality. ADRENAL GLANDS: No acute abnormality. KIDNEYS, URETERS AND BLADDER: Kidneys, ureters, and the bladder are normal. No stones in the kidneys or ureters. No hydronephrosis. No perinephric or periureteral stranding. GI AND BOWEL:  Stomach, small bowel, and the colon is not abnormally distended. No wall thickening or inflammatory stranding. The appendix is normal. PERITONEUM AND RETROPERITONEUM: No ascites. No free air. VASCULATURE: Normal caliber abdominal aorta. LYMPH NODES: No lymphadenopathy. REPRODUCTIVE ORGANS: Prostate gland is not enlarged. BONES AND SOFT TISSUES: No acute osseous abnormality. Spondylolysis with mild spondylolisthesis at L5-S1. No focal soft tissue abnormality. IMPRESSION: 1. No renal or ureteral stone or obstruction. Electronically signed by: Elsie Gravely MD 03/01/2024 01:26 AM EST RP Workstation: [id number]    Medications Ordered in the ED  acetaminophen  (TYLENOL ) tablet 975 mg (975 mg Oral Given 03/01/24 0013)  Medical Decision Making Amount and/or Complexity of Data Reviewed Labs: ordered. Radiology: ordered.  Risk OTC drugs.   Patient presents to the ED for concern of body ache, fatigue, decreased appetite, low back pain, this involves an extensive number of treatment options, and is a complaint that carries with it a high risk of complications and morbidity.  The differential diagnosis includes UTI, kidney stone, dehydration, AKI, electrolyte abnormality, hypoglycemia, viral URI, gastroenteritis, ACS, MSK, strain, cauda equina, radiculopathy, spinal abscess   Co morbidities that complicate the patient evaluation  See HPI   Additional history obtained:  Additional history obtained from Nursing and Outside Medical Records   External records from outside source obtained and reviewed including triage note, cardiology note from 2023   Lab Tests:  I Ordered, and personally interpreted labs.  The pertinent results include:   UA without infection or Hgb Influenza positive CBG 114 WBC 14.5 Troponin negative   Imaging Studies ordered:  I ordered imaging studies including CT renal stone study I independently visualized and interpreted imaging  which showed no acute abnormalities I agree with the radiologist interpretation   Cardiac Monitoring:  The patient was maintained on a cardiac monitor.  I personally viewed and interpreted the cardiac monitored which showed an underlying rhythm of: NSR with new T wave abnormalities in lateral leads   Medicines ordered and prescription drug management:  I ordered medication including Tylenol  for fever, pain Reevaluation of the patient after these medicines showed that the patient improved I have reviewed the patients home medicines and have made adjustments as needed    Problem List / ED Course:  Lumbar back pain Vital signs notable for tachycardia and fever Pain is localized to lumbar spinous processes and bilateral paraspinous processes and lumbar region No history of lumbar back pain.  Patient is 23 and unlikely to have DDD No history of HIV, chronic steroids, malignancy, IVDU, cancer.  Denies saddle paresthesia, urinary incontinence.  Low suspicion of spinal abscess, cauda equina. Labs notable for leukocytosis of 14.5 Denies urinary symptoms.  Unlikely UTI however did obtain sample as patient does have a fever and tachycardia to ensure this is not etiology.  UA without infection nor Hgb Patient is neurologically intact and BLE.  No motor or sensory deficits to BLE.  DP 2+.  No signs of sciatica, radiculopathy, nor nerve impingement  CT renal stone study negative for stone, UTI, obvious MSK abnormalities Lumbar back pain likely secondary to current viral infection and/or MSK, muscle strain.  Provided patient with meloxicam  prescription and discussed other symptomatic treatment Provided strict return precautions on DC paperwork  Influenza Patient endorsed some fatigue, decreased appetite.  No cough, congestion.  No known sick contacts.  In setting of fever and tachycardia, did obtain respiratory panel which was positive for flu Lungs and CTAB with no hypoxia.  No signs of  respiratory distress.  Low suspicion for pneumonia. Provided patient with Tylenol  for pain, fever resolving fever, tachycardia Discussed symptomatic treatment at home  Abnormal EKG EKG was obtained in triage due to tachycardia Patient has absolutely no complaints of chest pain, shortness of breath and had none during the entirety of ED course EKG notable for inverted T waves in inferior lateral leads.  I did review EKG from 2024 which showed inferior T wave inversions but no inversion of T waves in the lateral leads.  Therefore, did obtain troponin.  Fortunately this is negative. I did consider PE in setting of abnormal EKG, tachycardia however patient has absolutely no  risk factors and is PERC negative following tachycardia resolving following Tylenol  for fever Patient has been seen by cardiology in past for syncopal episode and HTN.  Cardiology suspected patient had orthostatic hypotension which was the etiology of syncope.  He had a 4-week Holter monitor which was notable for sinus rhythm and sinus tachycardia with no syncope while wearing the monitor Will provide patient with cardiology referral due to new T wave inversions.  He is unlikely a current patient as he last saw cardiology in 2023   Reevaluation:  After the interventions noted above, I reevaluated the patient and found that they have :improved   Dispostion:  After consideration of the diagnostic results and the patients response to treatment, I feel that the patent would benefit from outpatient management symptomatic treatment  Discussed ED workup, disposition, return to ED precautions with patient who expresses understanding agrees with plan.  All questions answered to their satisfaction.  They are agreeable to plan.  Discharge instructions provided on paperwork  Final diagnoses:  Acute low back pain without sciatica, unspecified back pain laterality  Influenza  Abnormal EKG    ED Discharge Orders          Ordered     Ambulatory referral to Cardiology       Comments: If you have not heard from the Cardiology office within the next 72 hours please call [phone].   03/01/24 0556             Minnie Tinnie BRAVO, PA 03/01/24 9441    Darra Fonda MATSU, MD 03/02/24 [phone]  "

## 2024-03-07 DIAGNOSIS — S43006A Unspecified dislocation of unspecified shoulder joint, initial encounter: Secondary | ICD-10-CM | POA: Insufficient documentation

## 2024-03-09 NOTE — Progress Notes (Signed)
" °  Cardiology Office Note:   Date:  03/10/2024  ID:  Austin Perkins, DOB 11/17/2000, MRN [id number] PCP: Clarance Elvie BRAVO, MD  [facility] Providers Cardiologist:  None {  History of Present Illness:   Austin Perkins is a 24 y.o. male who presents for evaluation of an abnormal EKG.  This was noted in the ED last week.  EKG demonstrated probably early repolarization changes.   I reviewed these records for this visit.     I had seen him in the past for treatment of HTN.  This was in 2023.  He was also having palpitations at that time and had syncope.  He wore a monitor but there were no arrhythmias.  He did have repolarization changes on his EKG back then.  His blood pressure was not controlled and he was treated with amlodipine patsie.  However, he stopped taking it when he ran out of the prescription.  He has not been monitoring his blood pressure.  When he went to the emergency room he was ultimately noted to have influenza A.  He was then back pain and dizziness.  He had fatigue and decreased appetite.  He ultimately recovered from this.  He works driving a chief executive officer.  He does not sleep much because he does second shift and he gets home and takes care of a 51-month-old.  However, he is not describing any chest pressure, neck or arm discomfort.  He is not been having any palpitations, presyncope or syncope.  He has no PND or orthopnea.  ROS: As stated in the HPI and negative for all other systems.  Studies Reviewed:    EKG:     Sinus rhythm, rate 64, axis within normal limits, intervals within normal limits, early repolarization pattern not significantly different than previous 2023.  Risk Assessment/Calculations:         Physical Exam:   VS:  BP 118/78   Pulse 64   Ht 5' 11 (1.803 m)   Wt 212 lb (96.2 kg)   SpO2 99%   BMI 29.57 kg/m    Wt Readings from Last 3 Encounters:  03/10/24 212 lb (96.2 kg)  02/29/24 205 lb (93 kg)  12/11/23 210 lb (95.3 kg)     GEN:  Well nourished, well developed in no acute distress NECK: No JVD; No carotid bruits CARDIAC: RRR, no murmurs, rubs, gallops RESPIRATORY:  Clear to auscultation without rales, wheezing or rhonchi  ABDOMEN: Soft, non-tender, non-distended EXTREMITIES:  No edema; No deformity   ASSESSMENT AND PLAN:       ABNORMAL EKG: This seems to represent an early repolarization pattern and has been there.  He is not having any anginal symptoms.  In the future we might assess with an echocardiogram but he has no physical findings suggestive of hypertrophic cardiomyopathy or other abnormalities.  He needs better blood pressure control as below.  Hypertension will get a look into trying to get him a blood pressure cuff.  I am going to renew his amlodipine /benazepril .  I want him to then start keeping a blood pressure diary and we will schedule him for follow-up in the blood pressure clinic.  Labs have been unremarkable including previous urinalysis.  I do not see a TSH and we can consider checking this in the future.   Follow up with HTN clinic in 3 months.   Signed, Lynwood Schilling, MD   "

## 2024-03-10 ENCOUNTER — Encounter: Payer: Self-pay | Admitting: Cardiology

## 2024-03-10 ENCOUNTER — Ambulatory Visit: Attending: Cardiology | Admitting: Cardiology

## 2024-03-10 VITALS — BP 118/78 | HR 64 | Ht 71.0 in | Wt 212.0 lb

## 2024-03-10 DIAGNOSIS — R9431 Abnormal electrocardiogram [ECG] [EKG]: Secondary | ICD-10-CM

## 2024-03-10 DIAGNOSIS — I10 Essential (primary) hypertension: Secondary | ICD-10-CM

## 2024-03-10 MED ORDER — AMLODIPINE BESY-BENAZEPRIL HCL 5-20 MG PO CAPS: 1.0000 | ORAL_CAPSULE | Freq: Every day | ORAL | 3 refills | Status: AC

## 2024-03-10 MED ORDER — OMRON 3 SERIES BP MONITOR DEVI: 1.0000 | Freq: Two times a day (BID) | 0 refills | Status: AC

## 2024-03-10 NOTE — Patient Instructions (Addendum)
 Medication Instructions:  Your physician recommends that you continue on your current medications as directed. Please refer to the Current Medication list given to you today.  *If you need a refill on your cardiac medications before your next appointment, please call your pharmacy*  Lab Work: NONE If you have labs (blood work) drawn today and your tests are completely normal, you will receive your results only by: MyChart Message (if you have MyChart) OR A paper copy in the mail If you have any lab test that is abnormal or we need to change your treatment, we will call you to review the results.  Testing/Procedures: NONE  Follow-Up: At [facility], you and your health needs are our priority.  As part of our continuing mission to provide you with exceptional heart care, our providers are all part of one team.  This team includes your primary Cardiologist (physician) and Advanced Practice Providers or APPs (Physician Assistants and Nurse Practitioners) who all work together to provide you with the care you need, when you need it.  Your next appointment:   6 weeks  Provider:   [facility]  We recommend signing up for the patient portal called MyChart.  Sign up information is provided on this After Visit Summary.  MyChart is used to connect with patients for Virtual Visits (Telemedicine).  Patients are able to view lab/test results, encounter notes, upcoming appointments, etc.  Non-urgent messages can be sent to your provider as well.   To learn more about what you can do with MyChart, go to [email or web address].   Blood pressure diary: take your blood pressure 2 times daily for 10 days and send us  the readings

## 2024-03-16 ENCOUNTER — Other Ambulatory Visit: Payer: Self-pay | Admitting: Medical Genetics

## 2024-04-10 ENCOUNTER — Other Ambulatory Visit (HOSPITAL_COMMUNITY)

## 2024-04-19 ENCOUNTER — Ambulatory Visit
